# Patient Record
Sex: Male | Born: 1955
Health system: Southern US, Community
[De-identification: ages and names within clinical notes are randomized; demographics above are authoritative.]

## PROBLEM LIST (undated history)

## (undated) DIAGNOSIS — I213 ST elevation (STEMI) myocardial infarction of unspecified site: Secondary | ICD-10-CM

## (undated) DIAGNOSIS — G629 Polyneuropathy, unspecified: Secondary | ICD-10-CM

## (undated) DIAGNOSIS — E785 Hyperlipidemia, unspecified: Secondary | ICD-10-CM

## (undated) DIAGNOSIS — T7840XA Allergy, unspecified, initial encounter: Secondary | ICD-10-CM

## (undated) DIAGNOSIS — I2510 Atherosclerotic heart disease of native coronary artery without angina pectoris: Secondary | ICD-10-CM

## (undated) DIAGNOSIS — E038 Other specified hypothyroidism: Secondary | ICD-10-CM

## (undated) DIAGNOSIS — N400 Enlarged prostate without lower urinary tract symptoms: Secondary | ICD-10-CM

## (undated) DIAGNOSIS — L219 Seborrheic dermatitis, unspecified: Secondary | ICD-10-CM

## (undated) DIAGNOSIS — E039 Hypothyroidism, unspecified: Secondary | ICD-10-CM

## (undated) DIAGNOSIS — I10 Essential (primary) hypertension: Secondary | ICD-10-CM

## (undated) HISTORY — DX: Hyperlipidemia, unspecified: E78.5

## (undated) HISTORY — DX: Seborrheic dermatitis, unspecified: L21.9

## (undated) HISTORY — DX: Other specified hypothyroidism: E03.8

## (undated) HISTORY — PX: APPENDECTOMY: SHX54

## (undated) HISTORY — DX: Allergy, unspecified, initial encounter: T78.40XA

## (undated) HISTORY — DX: Polyneuropathy, unspecified: G62.9

## (undated) HISTORY — DX: Atherosclerotic heart disease of native coronary artery without angina pectoris: I25.10

## (undated) HISTORY — DX: ST elevation (STEMI) myocardial infarction of unspecified site: I21.3

## (undated) HISTORY — DX: Benign prostatic hyperplasia without lower urinary tract symptoms: N40.0

## (undated) HISTORY — DX: Essential (primary) hypertension: I10

## (undated) HISTORY — DX: Hypothyroidism, unspecified: E03.9

---

## 1998-08-16 ENCOUNTER — Encounter: Admission: RE | Admit: 1998-08-16 | Discharge: 1998-09-05 | Payer: Self-pay | Admitting: Family Medicine

## 2003-12-03 LAB — HM COLONOSCOPY

## 2010-10-22 ENCOUNTER — Encounter: Payer: Self-pay | Admitting: Family Medicine

## 2010-10-22 DIAGNOSIS — E039 Hypothyroidism, unspecified: Secondary | ICD-10-CM | POA: Insufficient documentation

## 2010-10-22 DIAGNOSIS — L219 Seborrheic dermatitis, unspecified: Secondary | ICD-10-CM | POA: Insufficient documentation

## 2010-10-22 DIAGNOSIS — K649 Unspecified hemorrhoids: Secondary | ICD-10-CM | POA: Insufficient documentation

## 2010-10-22 DIAGNOSIS — G629 Polyneuropathy, unspecified: Secondary | ICD-10-CM

## 2010-10-22 DIAGNOSIS — E785 Hyperlipidemia, unspecified: Secondary | ICD-10-CM

## 2012-07-18 ENCOUNTER — Other Ambulatory Visit: Payer: Self-pay | Admitting: Physician Assistant

## 2012-07-20 LAB — T4, FREE: Free T4: 1.12 ng/dL (ref 0.80–1.80)

## 2012-07-21 ENCOUNTER — Ambulatory Visit (INDEPENDENT_AMBULATORY_CARE_PROVIDER_SITE_OTHER): Payer: No Typology Code available for payment source | Admitting: Physician Assistant

## 2012-07-21 ENCOUNTER — Encounter: Payer: Self-pay | Admitting: Physician Assistant

## 2012-07-21 VITALS — BP 124/96 | HR 88 | Temp 98.8°F | Resp 18 | Ht 71.5 in | Wt 203.0 lb

## 2012-07-21 DIAGNOSIS — E038 Other specified hypothyroidism: Secondary | ICD-10-CM

## 2012-07-21 DIAGNOSIS — E785 Hyperlipidemia, unspecified: Secondary | ICD-10-CM

## 2012-07-21 DIAGNOSIS — G609 Hereditary and idiopathic neuropathy, unspecified: Secondary | ICD-10-CM

## 2012-07-21 DIAGNOSIS — N400 Enlarged prostate without lower urinary tract symptoms: Secondary | ICD-10-CM | POA: Insufficient documentation

## 2012-07-21 MED ORDER — TAMSULOSIN HCL 0.4 MG PO CAPS: 0.4000 mg | ORAL_CAPSULE | Freq: Every day | ORAL | Status: DC

## 2012-07-21 MED ORDER — PREGABALIN 100 MG PO CAPS: 100.0000 mg | ORAL_CAPSULE | Freq: Three times a day (TID) | ORAL | Status: DC

## 2012-07-21 NOTE — Progress Notes (Signed)
   Patient ID: Cory Wong MRN: [id number], DOB: 1956-03-29, 57 y.o. Date of Encounter: 07/21/2012, 4:52 PM    Chief Complaint: Here to f/u chronic problems  HPI: 57 y.o. year old male here for routine f/u. 1- Peripheral Neuropathy: Currently on Lyrica 100 bid.  Feels pain whenever shoes are in contact with foot. As soon as he is bare footed, pain immediately resolves. When feet get hot, they swell some, and pain worsens.  At work, stands on concrete floor all day. Often gets hot.  2- BPH/OAB:  Pt took Flomax 0.4 for a while. It caused some side effects and did not help so he quit it after approx. 3 weeks. Vesicare caused him to be unable to urinate. Only took it once and quit it.  Has hesitancy. Nocturia. When needs to urinate, if does not go immediatley, develops severe pain.      Home Meds: Lyrica 100mg  BID      Allergies: No Known Allergies    Review of Systems: Constitutional: negative for chills, fever, night sweats, weight changes, or fatigue  HEENT: negative for vision changes, hearing loss, congestion, rhinorrhea, ST, epistaxis, or sinus pressure Cardiovascular: negative for chest pain or palpitations Respiratory: negative for hemoptysis, wheezing, shortness of breath, or cough Abdominal: negative for abdominal pain, nausea, vomiting, diarrhea, or constipation Dermatological: negative for rash Neurologic: negative for headache, dizziness, or syncope    Physical Exam: Blood pressure 124/96, pulse 88, temperature 98.8 F (37.1 C), temperature source Oral, resp. rate 18, height 5' 11.5" (1.816 m), weight 203 lb (92.08 kg)., Body mass index is 27.92 kg/(m^2). General: Well developed, well nourished, in no acute distress.  Neck: Supple. No thyromegaly. Full ROM. No lymphadenopathy. Lungs: Clear bilaterally to auscultation without wheezes, rales, or rhonchi. Breathing is unlabored. Heart: RRR with S1 S2. No murmurs, rubs, or gallops appreciated. Abdomen: Soft,  non-tender, non-distended with normoactive bowel sounds. No hepatomegaly. No rebound/guarding. No obvious abdominal masses. Msk:  Strength and tone normal for age. Extremities/Skin: Warm and dry. No clubbing or cyanosis. No edema. No rashes or suspicious lesions. Neuro: Alert and oriented X 3. Moves all extremities spontaneously. Gait is normal. CNII-XII grossly in tact. Psych:  Responds to questions appropriately with a normal affect.   Labs:   ASSESSMENT AND PLAN:  57 y.o. year old male with: 1- Peripheral Neuropathy: had nml B12 6/12 & 3/13.  Discussed neuro referral/ nerve conduction study. He declines at this time. Wants to try increased lyrica first.  Rx Lyrica 100mg  TID 2- BPH/OAB: Discussed referral to urology as his symptoms are unusual. He declines referral now. Wants to retry flomax.  Rx given. PSA nml. 3- mixed Dyslipidemia: High triglycerides, Low HDL. Cont fish oil. Decrease bread, potato, pasta, sweets. Increase cardiovascular exercise.   -4- Subclinical Hypothyroid; cont to monitor.  [address] [place], [place], [facility] 07/21/2012 4:52 PM

## 2012-12-07 ENCOUNTER — Other Ambulatory Visit: Payer: Self-pay | Admitting: Physician Assistant

## 2012-12-08 ENCOUNTER — Encounter: Payer: Self-pay | Admitting: Family Medicine

## 2012-12-08 NOTE — Telephone Encounter (Signed)
Medication refilled per protocol.  Letter to patient due for check up

## 2013-01-05 ENCOUNTER — Encounter: Payer: Self-pay | Admitting: Physician Assistant

## 2013-01-05 ENCOUNTER — Ambulatory Visit (INDEPENDENT_AMBULATORY_CARE_PROVIDER_SITE_OTHER): Payer: No Typology Code available for payment source | Admitting: Physician Assistant

## 2013-01-05 VITALS — BP 130/88 | HR 68 | Temp 98.0°F | Resp 18 | Wt 200.0 lb

## 2013-01-05 DIAGNOSIS — N400 Enlarged prostate without lower urinary tract symptoms: Secondary | ICD-10-CM

## 2013-01-05 DIAGNOSIS — E038 Other specified hypothyroidism: Secondary | ICD-10-CM

## 2013-01-05 DIAGNOSIS — G629 Polyneuropathy, unspecified: Secondary | ICD-10-CM

## 2013-01-05 DIAGNOSIS — E039 Hypothyroidism, unspecified: Secondary | ICD-10-CM

## 2013-01-05 DIAGNOSIS — G609 Hereditary and idiopathic neuropathy, unspecified: Secondary | ICD-10-CM

## 2013-01-05 NOTE — Progress Notes (Signed)
Patient ID: STEFFEN HASE MRN: [id number], DOB: 1955/05/07, 57 y.o. Date of Encounter: @DATE @  Chief Complaint:  Chief Complaint  Patient presents with  . 6 mth check up    c/o numbness in hands    HPI: 57 y.o. year old white male  presents for her regular office visit followup.  #1 peripheral neuropathy: He has had this problem for years. He feels pain in his feet whenever his shoes are in contact with his foot. As soon as he is barefooted the pain immediately resolves. In addition to contact with his shoes off set of the symptoms are worse whenever his feet are hot.  For his job, he stands on concrete floor all day. They do not have air condition so it often gets very hot. It is at these times he has the worst symptoms. The discomfort does not extend beyond the ankles. This is it is symmetrical bilaterally. In July 2011 he had lab evaluation which was normal. This included a B12 TSH A1c. This is been treated with Lyrica. At the last office visit he was on 100 mg twice a day. At the last office visit we increased it to 3 times a day. However he had states that he had a hard time around to take that second toes because of his work schedule. He has gone back to his prior dose of 100 mg twice a day. He says this is working well. He feels that this is stable and controlled well enough.  #2 BPH: At his last office visit 07/21/12 he restarted Flomax. He says that he is taking this daily now and it is helping. Says in the past he was waking 3-4 times a night having to urinate. He currently only wakes once with nocturia.  #3: He has recently been riding his bicycle on Saturdays with his daughter. They were not on trails that require him to grip his handles firmly. After riding for a while his hands begin to feel numb. We discussed his job and he states that he does have to do a repetitive twisting motion with his hand and wrist at work often. He never wakes at night secondary to any numbness  tingling or pain in the hands or fingers.   Past Medical History  Diagnosis Date  . Allergy   . Elevated lipids   . Seborrhea   . Hemorrhoids   . Peripheral neuropathy   . Subclinical hypothyroidism      Home Meds: See attached medication section for current medication list. Any medications entered into computer today will not appear on this note's list. The medications listed below were entered prior to today. Current Outpatient Prescriptions on File Prior to Visit  Medication Sig Dispense Refill  . fish oil-omega-3 fatty acids 1000 MG capsule Take 500 mg by mouth daily. Takes two 500mg  pills a day      . Multiple Vitamin (MULTIVITAMIN) tablet Take 1 tablet by mouth daily.      . tamsulosin (FLOMAX) 0.4 MG CAPS capsule TAKE 1 CAPSULE (0.4 MG TOTAL) BY MOUTH DAILY.  30 capsule  1   No current facility-administered medications on file prior to visit.    Allergies: No Known Allergies  History   Social History  . Marital Status: Single    Spouse Name: N/A    Number of Children: N/A  . Years of Education: N/A   Occupational History  . Not on file.   Social History Main Topics  . Smoking status: Never  Smoker   . Smokeless tobacco: Never Used  . Alcohol Use: No  . Drug Use: No  . Sexual Activity: Not on file   Other Topics Concern  . Not on file   Social History Narrative  . No narrative on file    No family history on file.   Review of Systems:  See HPI for pertinent ROS. All other ROS negative.    Physical Exam: Blood pressure 130/88, pulse 68, temperature 98 F (36.7 C), temperature source Oral, resp. rate 18, weight 200 lb (90.719 kg)., Body mass index is 27.51 kg/(m^2). General: Well-nourished well-developed white male. Appears in no acute distress. Neck: Supple. No thyromegaly. No lymphadenopathy. Lungs: Clear bilaterally to auscultation without wheezes, rales, or rhonchi. Breathing is unlabored. Heart: RRR with S1 S2. No murmurs, rubs, or  gallops. Musculoskeletal:  Strength and tone normal for age. Tinnells test is negative bilaterally. Phalen test is negative bilaterally. Extremities/Skin: Warm and dry. No clubbing or cyanosis. No edema. No rashes or suspicious lesions. Neuro: Alert and oriented X 3. Moves all extremities spontaneously. Gait is normal. CNII-XII grossly in tact. Psych:  Responds to questions appropriately with a normal affect.     ASSESSMENT AND PLAN:  57 y.o. year old male with  1. Peripheral neuropathy of the feet bilaterally: Stable and controlled. Continue Lyrica 100 mg by mouth twice a day.  2. BPH (benign prostatic hyperplasia) Stable and controlled. Continue Flomax. PSA was performed 07/18/12 and was normal. Can wait one year to repeat.  3. Subclinical hypothyroidism - TSH - T4, free  4. discussed causes of the numbness he is feeling in his hands when he is gripping the bicycle handles a prolonged period. As well he does repetitive motion of his wrist and hands at work. Discussed trying to use a wrist splint to keep his wrist in neutral position. He defers this. Also discussed trying to concentrate old that he can ride in to have some areas of flat smooth surface that he can loosen his grip during these times and rest the wrist and hands at this time is to relieve any compression of nerves. He opts to do this. We'll follow up as needed.  [address] [place], [place], [facility] 01/05/2013 1:54 PM

## 2013-02-08 ENCOUNTER — Other Ambulatory Visit: Payer: Self-pay | Admitting: Physician Assistant

## 2013-02-09 NOTE — Telephone Encounter (Signed)
Medication refilled per protocol. 

## 2013-03-17 ENCOUNTER — Other Ambulatory Visit: Payer: Self-pay | Admitting: Physician Assistant

## 2013-03-17 NOTE — Telephone Encounter (Signed)
Ok to refill 

## 2013-03-20 NOTE — Telephone Encounter (Signed)
Ok to refill 

## 2013-03-20 NOTE — Telephone Encounter (Signed)
Approved for #90+2 additional refills 

## 2013-03-20 NOTE — Telephone Encounter (Signed)
Meds refilled.

## 2013-08-09 ENCOUNTER — Other Ambulatory Visit: Payer: Self-pay | Admitting: Physician Assistant

## 2013-08-09 DIAGNOSIS — G629 Polyneuropathy, unspecified: Secondary | ICD-10-CM

## 2013-08-09 NOTE — Telephone Encounter (Signed)
Last Rf 03/17/13 #90 + 2  Last OV 01/05/13  OK refill?

## 2013-08-09 NOTE — Telephone Encounter (Signed)
Yes it is okay to give him enough refills to last until September 2015.

## 2013-08-10 NOTE — Telephone Encounter (Signed)
RX called in .

## 2013-09-11 ENCOUNTER — Encounter: Payer: Self-pay | Admitting: Physician Assistant

## 2013-09-11 ENCOUNTER — Ambulatory Visit (INDEPENDENT_AMBULATORY_CARE_PROVIDER_SITE_OTHER): Payer: PRIVATE HEALTH INSURANCE | Admitting: Physician Assistant

## 2013-09-11 VITALS — BP 144/104 | HR 68 | Temp 98.4°F | Resp 18 | Ht 73.0 in | Wt 202.0 lb

## 2013-09-11 DIAGNOSIS — F32A Depression, unspecified: Secondary | ICD-10-CM | POA: Insufficient documentation

## 2013-09-11 DIAGNOSIS — F3289 Other specified depressive episodes: Secondary | ICD-10-CM

## 2013-09-11 DIAGNOSIS — G609 Hereditary and idiopathic neuropathy, unspecified: Secondary | ICD-10-CM

## 2013-09-11 DIAGNOSIS — G629 Polyneuropathy, unspecified: Secondary | ICD-10-CM

## 2013-09-11 DIAGNOSIS — F329 Major depressive disorder, single episode, unspecified: Secondary | ICD-10-CM | POA: Insufficient documentation

## 2013-09-11 HISTORY — DX: Depression, unspecified: F32.A

## 2013-09-11 MED ORDER — SERTRALINE HCL 50 MG PO TABS: 50.0000 mg | ORAL_TABLET | Freq: Every day | ORAL | Status: DC

## 2013-09-11 MED ORDER — PREGABALIN 100 MG PO CAPS: 100.0000 mg | ORAL_CAPSULE | Freq: Two times a day (BID) | ORAL | Status: DC

## 2013-09-11 NOTE — Progress Notes (Signed)
Patient ID: Cory Wong MRN: [id number], DOB: 09/06/1955, 58 y.o. Date of Encounter: @DATE @  Chief Complaint:  Chief Complaint  Patient presents with  . stress at work    needs form to return to work,  is seeing counsler, needs forms done today    HPI: 58 y.o. year old white male  presents with above complaint.   He reports that it was about 2 weeks ago that he was at work and made a statement. Says that he was just kidding around but that "a temp"/coworker  "took him seriously". Patient states that work has been stressful. He says that to deal with the stress that he, as well as the other coworkers, kid around. 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 However he states that this temp/coworker apparently went home and thought it over and was concerned. Says that nothing was said to him about this comment immediately, but he states that on Friday, May 1st, he  (patient) was called at home and was told not to return to work until he had appropriate evaluation and treatment.  Today he reports that he is seeing a Veterinary surgeon. Says that so far he has seen Counselor once and that was this past Thursday at 3:00 PM--would've been Thursday, May 7. Says he has a second appointment scheduled with Counselor for today at 3:00.  He says that the counselor has told him that they feel that he has diagnosis of depression.  Patient makes comments that he does not feel depressed except for when he is at work. As well, previously in our conversation he had mentioned his job being stressful. I asked him more about his job. He says the job is not difficult but it is stressful because of the amount of productivity that is expected in a a limited amount of time. Says that he works on old [employer]. Says that he makes them look brand-new. Says this is a job that should take days/multiple hours but instead they expect  finished product  in very limited amount of time. He says that all of the other employees feel the same and they are all feeling stressed and overwhelmed. Says "the company is trying to do more with less people."   Since he made the comment that his depression is only at work, I asked him when was the last time that he was out of work for a whole week. He says that he was off work for one week in March. Says that he stayed around the house and did not go away on a vacation. I asked him if he felt depressed during that week. His response is that he has noticed that things that he used to be interested in doing he just doesn't really feel interested in doing now.  He also says that "Part of the  problem is his daughter".   Says that all she does is sit in the house and play video games. Stays up late at night and talks to friends on line. However says that she has no true friends that she actually talks to in person but only on the computer. Says that she has no driver's license and has no job. Says that at home-- it is just him and  the daughter-- Since he got divorced in 2007.  Says he goes to work and deals with that stress and comes home and  deals with this stress with his daughter.  He also brings up the topic of Lyrica.Whe he presented this information, his thoughts were jumbled and his conversation was very confusing. He presented it as if he thought that the Lyrica was the cause of his current problems. However, after going around and around, it turned out that: He states that in the past when he was on lower dose of Lyrica that he had no side effects and tolerated that fine. However when he increased the dose up to 3 times a day he felt that he cannot concentrate and cannot focus. Says he constantly was feeling confused and symptoms of mental slowing. Says 2 months after her we increased the dose to 100 mg 3 times a day he was having these side effects and he decreased the dose down to taking one  pill twice daily instead of one 3 times daily. Says that when he went back down to the dose of one pill twice daily, he no longer felt those side effects.   Also states that he absolutely has to take the Lyrica. Says that without the Lyrica he cannot even stand for his shoes  to touch his feet.  He denies any homicidal or suicidal ideation.  He reports  "little interest or pleasure in doing things.  Also reports feeling tired and having little energy.  Also reports feeling down depressed or hopeless.  Also reports feeling bad about himself-feeling like a failure or have let yourself/ your family down.   Reports trouble concentrating on things such as reading a newspaper or watching television.     Past Medical History  Diagnosis Date  . Allergy   . Elevated lipids   . Seborrhea   . Hemorrhoids   . Peripheral neuropathy   . Subclinical hypothyroidism      Home Meds: See attached medication section for current medication list. Any medications entered into computer today will not appear on this note's list. The medications listed below were entered prior to today. Current Outpatient Prescriptions on File Prior to Visit  Medication Sig Dispense Refill  . Multiple Vitamin (MULTIVITAMIN) tablet Take 1 tablet by mouth daily.      . tamsulosin (FLOMAX) 0.4 MG CAPS capsule TAKE ONE CAPSULE EVERY DAY  30 capsule  6  . fish oil-omega-3 fatty acids 1000 MG capsule Take 500 mg by mouth daily. Takes two 500mg  pills a day       No current facility-administered medications on file prior to visit.    Allergies: No Known Allergies  History   Social History  . Marital Status: Single    Spouse Name: N/A    Number of Children: N/A  . Years of Education: N/A   Occupational History  . Not on file.   Social History Main Topics  . Smoking status: Never Smoker   . Smokeless tobacco: Never Used  . Alcohol Use: No  . Drug Use: No  . Sexual Activity: Not on file   Other Topics Concern  . Not  on file   Social History Narrative  . No narrative on file    No family history on file.   Review of Systems:  See HPI for pertinent ROS. All other ROS negative.    Physical Exam: Blood pressure 144/94, pulse 68, temperature 98.4 F (36.9 C), temperature source Oral, resp. rate 18, height 6\' 1"  (1.854 m), weight 202 lb (91.627 kg)., Body mass index is 26.66 kg/(m^2). General: WNWD WM.  Appears in no  acute distress. Neck: Supple. No thyromegaly. No lymphadenopathy. Lungs: Clear bilaterally to auscultation without wheezes, rales, or rhonchi. Breathing is unlabored. Heart: RRR with S1 S2. No murmurs, rubs, or gallops. Musculoskeletal:  Strength and tone normal for age. Extremities/Skin: Warm and dry.  Neuro: Alert and oriented X 3. Moves all extremities spontaneously. Gait is normal. CNII-XII grossly in tact. Psych:  He appears anxious. The information he provides to me is choppy--     ASSESSMENT AND PLAN:  58 y.o. year old male with  1. Depression - sertraline (ZOLOFT) 50 MG tablet; Take 1 tablet (50 mg total) by mouth daily.  Dispense: 30 tablet; Refill: 1  2. Peripheral neuropathy - pregabalin (LYRICA) 100 MG capsule; Take 1 capsule (100 mg total) by mouth 2 (two) times daily.  Dispense: 60 capsule; Refill: 5  Discussed stopping the Lyrica but he says that he absolutely has to have this for his feet and also says that he does not think that this is what is causing these current symptoms.  Says that when he first started Lyrica on low dose he was having no side effects. Also, since he went back down to the lower dose-- he has not noticed any side effects. He is agreeable to start medication for depression. Says that his counselor told him that he needed to come here and get medication for depression. He states that he has never been on any such medication in the past. Start Zoloft. Discussed proper expectations at length with him. Discussed that if he is having any significant  side effects to call us immediately. Otherwise, even if  he is seeing no improvement,  to continue taking the medication daily. Discussed that it will take weeks for the medicine to build up in his system to see any effect. Will have him schedule a followup office visit here in 4 weeks to reassess. Followup sooner if he is feeling worse at all or having any adverse effects to the medicine. He will continue to follow up with a counselor in the meantime as well. He reports that he has an appointment with the counselor for this afternoon at 3:00 PM.  NOTE: HE HAS FORM FOR WORK FOR ME TO COMPLETE. He has scheduled his followup office visit with me for Monday 10/09/2013. On his forms I have completed that he will be out of work through 10/09/2013. At his followup appointment 10/09/2013, we will re-assess his mood/ his response to the Zoloft and counseling, and re-assess when he can return to work.  Also, I donot know if it is an option for him to return to work but be given longer amount of time to complete tasks etc.  Also, I will try to followup with his therapist/psychologist and get reports from them to have their input.  > 30 minutes spent in direct face-to-face contact and communication with the patient.  Forms have been completed. Copy sent to be scanned.  Kim to call pt and get name, contact info of counselor and get pts permission for me to contact counselor. Also will inform pt that I put on form for pt to be oow until f/u OV with me 10/09/13.  [address]igned, [place], [place], [facility] 09/11/2013 12:06 PM

## 2013-09-12 ENCOUNTER — Telehealth: Payer: Self-pay | Admitting: Family Medicine

## 2013-09-12 NOTE — Telephone Encounter (Signed)
Pt made aware FMLA forms are ready for pick up.  Provider wanted to know where he was going for counseling.  He said he was seeing a Arrie Senate at [facility].  [address] in [place].  He said if need he would have no problem for the two providers to communicate.  Will have him sign release of information form when he comes to pick up his other forms.

## 2013-10-09 ENCOUNTER — Ambulatory Visit (INDEPENDENT_AMBULATORY_CARE_PROVIDER_SITE_OTHER): Payer: PRIVATE HEALTH INSURANCE | Admitting: Physician Assistant

## 2013-10-09 ENCOUNTER — Encounter: Payer: Self-pay | Admitting: Physician Assistant

## 2013-10-09 VITALS — BP 142/98 | HR 68 | Temp 98.8°F | Resp 18 | Wt 200.0 lb

## 2013-10-09 DIAGNOSIS — F329 Major depressive disorder, single episode, unspecified: Secondary | ICD-10-CM

## 2013-10-09 DIAGNOSIS — F32A Depression, unspecified: Secondary | ICD-10-CM

## 2013-10-09 DIAGNOSIS — F3289 Other specified depressive episodes: Secondary | ICD-10-CM

## 2013-10-09 MED ORDER — FLUOXETINE HCL 20 MG PO TABS: 20.0000 mg | ORAL_TABLET | Freq: Every day | ORAL | Status: DC

## 2013-10-09 NOTE — Progress Notes (Signed)
Patient ID: Cory Wong MRN: [id number], DOB: Oct 02, 1955, 58 y.o. Date of Encounter: @DATE @  Chief Complaint:  Chief Complaint  Patient presents with  . 4 week follow up    stomach pains from meds    HPI: 58 y.o. year old white male  presents with above complaint.   HE HAD OV 09/11/2013. THE FOLLOWING IS COPIED FROM THAT OV NOTE:   He reports that it was about 2 weeks ago that he was at work and made a statement. Says that he was just kidding around but that "a temp"/coworker  "took him seriously". Patient states that work has been stressful. He says that to deal with the stress that he, as well as the other coworkers, kid around. 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 However he states that this temp/coworker apparently went home and thought it over and was concerned. Says that nothing was said to him about this comment immediately, but he states that on Friday, May 1st, he  (patient) was called at home and was told not to return to work until he had appropriate evaluation and treatment.  Today he reports that he is seeing a Veterinary surgeon. Says that so far he has seen Counselor once and that was this past Thursday at 3:00 PM--would've been Thursday, May 7. Says he has a second appointment scheduled with Counselor for today at 3:00.  He says that the counselor has told him that they feel that he has diagnosis of depression.  Patient makes comments that he does not feel depressed except for when he is at work. As well, previously in our conversation he had mentioned his job being stressful. I asked him more about his job. He says the job is not difficult but it is stressful because of the amount of productivity that is expected in a a limited amount of time. Says that he works on old [employer]. Says that he makes them look brand-new. Says this is a job that should take days/multiple hours but  instead they expect finished product  in very limited amount of time. He says that all of the other employees feel the same and they are all feeling stressed and overwhelmed. Says "the company is trying to do more with less people."   Since he made the comment that his depression is only at work, I asked him when was the last time that he was out of work for a whole week. He says that he was off work for one week in March. Says that he stayed around the house and did not go away on a vacation. I asked him if he felt depressed during that week. His response is that he has noticed that things that he used to be interested in doing he just doesn't really feel interested in doing now.  He also says that "Part of the  problem is his daughter".   Says that all she does is sit in the house and play video games. Stays up late at night and talks to friends on line. However says that she has no true friends that she actually talks to in person but only on the computer. Says that she has no driver's license and has no job. Says that at home-- it is just him and  the daughter-- Since he got divorced in 2007.  Says he goes to work and deals with that  stress and comes home and deals with this stress with his daughter.  He also brings up the topic of Lyrica.Whe he presented this information, his thoughts were jumbled and his conversation was very confusing. He presented it as if he thought that the Lyrica was the cause of his current problems. However, after going around and around, it turned out that: He states that in the past when he was on lower dose of Lyrica that he had no side effects and tolerated that fine. However when he increased the dose up to 3 times a day he felt that he cannot concentrate and cannot focus. Says he constantly was feeling confused and symptoms of mental slowing. Says 2 months after her we increased the dose to 100 mg 3 times a day he was having these side effects and he decreased the  dose down to taking one pill twice daily instead of one 3 times daily. Says that when he went back down to the dose of one pill twice daily, he no longer felt those side effects.   Also states that he absolutely has to take the Lyrica. Says that without the Lyrica he cannot even stand for his shoes  to touch his feet.  He denies any homicidal or suicidal ideation.  He reports  "little interest or pleasure in doing things.  Also reports feeling tired and having little energy.  Also reports feeling down depressed or hopeless.  Also reports feeling bad about himself-feeling like a failure or have let yourself/ your family down.   Reports trouble concentrating on things such as reading a newspaper or watching television.  THE FOLLOWING IS THE ASSESSMENT / PLAN FROM OV NOTE 09/11/2013:   1. Depression - sertraline (ZOLOFT) 50 MG tablet; Take 1 tablet (50 mg total) by mouth daily.  Dispense: 30 tablet; Refill: 1  2. Peripheral neuropathy - pregabalin (LYRICA) 100 MG capsule; Take 1 capsule (100 mg total) by mouth 2 (two) times daily.  Dispense: 60 capsule; Refill: 5  Discussed stopping the Lyrica but he says that he absolutely has to have this for his feet and also says that he does not think that this is what is causing these current symptoms.  Says that when he first started Lyrica on low dose he was having no side effects. Also, since he went back down to the lower dose-- he has not noticed any side effects. He is agreeable to start medication for depression. Says that his counselor told him that he needed to come here and get medication for depression. He states that he has never been on any such medication in the past. Start Zoloft. Discussed proper expectations at length with him. Discussed that if he is having any significant side effects to call us immediately. Otherwise, even if  he is seeing no improvement,  to continue taking the medication daily. Discussed that it will take weeks for the  medicine to build up in his system to see any effect. Will have him schedule a followup office visit here in 4 weeks to reassess. Followup sooner if he is feeling worse at all or having any adverse effects to the medicine. He will continue to follow up with a counselor in the meantime as well. He reports that he has an appointment with the counselor for this afternoon at 3:00 PM.  NOTE: HE HAS FORM FOR WORK FOR ME TO COMPLETE. He has scheduled his followup office visit with me for Monday 10/09/2013. On his forms I have completed  that he will be out of work through 10/09/2013. At his followup appointment 10/09/2013, we will re-assess his mood/ his response to the Zoloft and counseling, and re-assess when he can return to work.  Also, I donot know if it is an option for him to return to work but be given longer amount of time to complete tasks etc.  Also, I will try to followup with his therapist/psychologist and get reports from them to have their input.  > 30 minutes spent in direct face-to-face contact and communication with the patient.  Forms have been completed. Copy sent to be scanned.  Kim to call pt and get name, contact info of counselor and get pts permission for me to contact counselor. Also will inform pt that I put on form for pt to be oow until f/u OV with me 10/09/13.   THE FOLLOWING IS THE INFO HE PROVIDES TO ME TODAY: He reports that for the first 2 weeks of taking the Zoloft he had some nausea and also some abdominal pain. However he was hoping that this is just him getting used to it and it would improve. However, he says instead, the abdominal pain worsened on the third week. He decided he would stop the medicine and fever the symptoms would resolve. The abdominal pain did resolve. Then he started the medicine back and abdominal pain reoccurred. At his last dose was Friday 09/05/13. Has had no medicine for 3 days now and currently is having no abdominal discomfort.  Also he has  continued to see the therapist. Has been going there once a week. Has 2 more sessions for total of 6 sessions. Says he is currently getting half pay --on short-term disability. Says that after the 6 session she will notify his work that he is ready to return to work.  Today he also tells me more about his daughter. Says she was homeschooled but she would get in major arguments with her mother. Therefore, she did not even finish up the home schooling and does not even have a GED. Says that she never goes out of the house. Does not even have a driver's license. Says that she plays video games all the time. Says that she wants to meet a  boyfriend through the computer.   Past Medical History  Diagnosis Date  . Allergy   . Elevated lipids   . Seborrhea   . Hemorrhoids   . Peripheral neuropathy   . Subclinical hypothyroidism      Home Meds: See attached medication section for current medication list. Any medications entered into computer today will not appear on this note's list. The medications listed below were entered prior to today. Current Outpatient Prescriptions on File Prior to Visit  Medication Sig Dispense Refill  . fish oil-omega-3 fatty acids 1000 MG capsule Take 500 mg by mouth daily. Takes two 500mg  pills a day      . Multiple Vitamin (MULTIVITAMIN) tablet Take 1 tablet by mouth daily.      . pregabalin (LYRICA) 100 MG capsule Take 1 capsule (100 mg total) by mouth 2 (two) times daily.  60 capsule  5  . tamsulosin (FLOMAX) 0.4 MG CAPS capsule TAKE ONE CAPSULE EVERY DAY  30 capsule  6   No current facility-administered medications on file prior to visit.    Allergies: No Known Allergies  History   Social History  . Marital Status: Single    Spouse Name: N/A    Number of Children: N/A  .  Years of Education: N/A   Occupational History  . Not on file.   Social History Main Topics  . Smoking status: Never Smoker   . Smokeless tobacco: Never Used  . Alcohol Use: No  .  Drug Use: No  . Sexual Activity: Not on file   Other Topics Concern  . Not on file   Social History Narrative  . No narrative on file    No family history on file.   Review of Systems:  See HPI for pertinent ROS. All other ROS negative.    Physical Exam: Blood pressure 144/94, pulse 68, temperature 98.4 F (36.9 C), temperature source Oral, resp. rate 18, height 6\' 1"  (1.854 m), weight 202 lb (91.627 kg)., Body mass index is 26.39 kg/(m^2). General: WNWD WM.  Appears in no acute distress. Neck: Supple. No thyromegaly. No lymphadenopathy. Lungs: Clear bilaterally to auscultation without wheezes, rales, or rhonchi. Breathing is unlabored. Heart: RRR with S1 S2. No murmurs, rubs, or gallops. Musculoskeletal:  Strength and tone normal for age. Extremities/Skin: Warm and dry.  Neuro: Alert and oriented X 3. Moves all extremities spontaneously. Gait is normal. CNII-XII grossly in tact. Psych:  He appears anxious. The information he provides to me is choppy--     ASSESSMENT AND PLAN:  58 y.o. year old male with   1. Depression - FLUoxetine (PROZAC) 20 MG tablet; Take 1 tablet (20 mg total) by mouth daily.  Dispense: 30 tablet; Refill: 1 Stay Off of the Zoloft. Start Prozac. Minded him again today--has adverse effects to notify me. Otherwise even if it seems to be having no effect continue taking it daily and to followup office visit. Continue followup with therapist as scheduled. Will schedule followup office visit here in 4 weeks or follow up sooner if needed.    [address] [place], [place], [place] [facility] 10/09/2013 4:59 PM

## 2013-10-13 ENCOUNTER — Telehealth: Payer: Self-pay | Admitting: *Deleted

## 2013-10-13 NOTE — Telephone Encounter (Signed)
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

## 2013-11-06 ENCOUNTER — Ambulatory Visit: Payer: PRIVATE HEALTH INSURANCE | Admitting: Physician Assistant

## 2016-09-29 ENCOUNTER — Encounter: Payer: Self-pay | Admitting: Family Medicine

## 2016-09-29 ENCOUNTER — Ambulatory Visit (INDEPENDENT_AMBULATORY_CARE_PROVIDER_SITE_OTHER): Payer: PRIVATE HEALTH INSURANCE | Admitting: Family Medicine

## 2016-09-29 VITALS — BP 138/82 | HR 76 | Temp 98.0°F | Resp 16 | Ht 73.0 in | Wt 186.0 lb

## 2016-09-29 DIAGNOSIS — N401 Enlarged prostate with lower urinary tract symptoms: Secondary | ICD-10-CM | POA: Diagnosis not present

## 2016-09-29 DIAGNOSIS — Z125 Encounter for screening for malignant neoplasm of prostate: Secondary | ICD-10-CM | POA: Diagnosis not present

## 2016-09-29 DIAGNOSIS — E039 Hypothyroidism, unspecified: Secondary | ICD-10-CM

## 2016-09-29 DIAGNOSIS — R1032 Left lower quadrant pain: Secondary | ICD-10-CM

## 2016-09-29 DIAGNOSIS — N138 Other obstructive and reflux uropathy: Secondary | ICD-10-CM

## 2016-09-29 DIAGNOSIS — R300 Dysuria: Secondary | ICD-10-CM | POA: Diagnosis not present

## 2016-09-29 DIAGNOSIS — Z0000 Encounter for general adult medical examination without abnormal findings: Secondary | ICD-10-CM | POA: Diagnosis not present

## 2016-09-29 DIAGNOSIS — Z131 Encounter for screening for diabetes mellitus: Secondary | ICD-10-CM | POA: Diagnosis not present

## 2016-09-29 DIAGNOSIS — R350 Frequency of micturition: Secondary | ICD-10-CM | POA: Diagnosis not present

## 2016-09-29 DIAGNOSIS — E038 Other specified hypothyroidism: Secondary | ICD-10-CM

## 2016-09-29 DIAGNOSIS — R3129 Other microscopic hematuria: Secondary | ICD-10-CM

## 2016-09-29 LAB — POC MICROSCOPIC URINALYSIS (UMFC): MUCUS RE: ABSENT

## 2016-09-29 LAB — POCT URINALYSIS DIP (MANUAL ENTRY)
Bilirubin, UA: NEGATIVE
GLUCOSE UA: NEGATIVE mg/dL
Ketones, POC UA: NEGATIVE mg/dL
Leukocytes, UA: NEGATIVE
NITRITE UA: NEGATIVE
PH UA: 6.5 (ref 5.0–8.0)
Protein Ur, POC: NEGATIVE mg/dL
Spec Grav, UA: <=1.005 — AB (ref 1.010–1.025)
UROBILINOGEN UA: 0.2 U/dL

## 2016-09-29 LAB — POCT CBC
Granulocyte percent: 61.7 %G (ref 37–80)
HEMATOCRIT: 46.7 % (ref 43.5–53.7)
Hemoglobin: 16.5 g/dL (ref 14.1–18.1)
LYMPH, POC: 1.8 (ref 0.6–3.4)
MCH, POC: 30.3 pg (ref 27–31.2)
MCHC: 35.2 g/dL (ref 31.8–35.4)
MCV: 86 fL (ref 80–97)
MID (cbc): 0.5 (ref 0–0.9)
MPV: 8.5 fL (ref 0–99.8)
POC GRANULOCYTE: 3.8 (ref 2–6.9)
POC LYMPH %: 29.6 % (ref 10–50)
POC MID %: 8.7 % (ref 0–12)
Platelet Count, POC: 255 10*3/uL (ref 142–424)
RBC: 5.43 M/uL (ref 4.69–6.13)
RDW, POC: 13.1 %
WBC: 6.2 10*3/uL (ref 4.6–10.2)

## 2016-09-29 LAB — POCT GLYCOSYLATED HEMOGLOBIN (HGB A1C): Hemoglobin A1C: 5.7

## 2016-09-29 NOTE — Progress Notes (Signed)
Chief Complaint  Patient presents with  . Abdominal Pain    Lower abdominal pain x months  . Dysuria  . Hypertension    (Not what pt is here for today but he did mention it has been an issue)    HPI   This a new patient here to establish care and to get a physical and discuss problems  Dysuria and urinary frequency Pt reports intermittent abdominal pain for 3-4 months The pain is aggravated when he had to work Fr, Sat, Sunday He states that at work if he drank coffee he would get a sting in his pelvis.  Once he goes to the bathroom to urinate his symptoms would get better.  His pain with urination has been in the past few weeks. He states that it might last for one hour in the morning and resolve over the course of the day. Now his urinary pain is present all day long.  He stopped drinking soda and citrus He started drinking cranberry juice which helps He states that in the mornings he was taking super beta prostate but stopped because it was helping him with urinary frequency.  Chronic constipation He states that he consumes fiber because he gets constipation He reports that he gets gassy from the fiber and notices that he gets pain with urination until he passes flatus.   Hypertension He stopped taking his bp medications because it was causing him to have constipation He reports that his blood pressures have been good Denies chest pains, palpitations, shortness of breath, lower extremity edema BP Readings from Last 3 Encounters:  09/29/16 138/82  10/09/13 (!) 142/98  09/11/13 (!) 144/104   Subclinical Hypothyroid  He reports that he was told that his thyroid was abnormal due to his lyrica use He states that he is off lyrica at this time He denies any symptoms of neck mass or goiter He reports that he is sleeping well and only has constipation No weakness  Past Medical History:  Diagnosis Date  . Allergy   . Elevated lipids   . Hemorrhoids   . Peripheral neuropathy     . Seborrhea   . Subclinical hypothyroidism     Current Outpatient Prescriptions  Medication Sig Dispense Refill  . Multiple Vitamin (MULTIVITAMIN) tablet Take 1 tablet by mouth daily.    . fish oil-omega-3 fatty acids 1000 MG capsule Take 500 mg by mouth daily. Takes two 500mg  pills a day     No current facility-administered medications for this visit.     Allergies: No Known Allergies  Past Surgical History:  Procedure Laterality Date  . APPENDECTOMY      Social History   Social History  . Marital status: Single    Spouse name: N/A  . Number of children: N/A  . Years of education: N/A   Social History Main Topics  . Smoking status: Never Smoker  . Smokeless tobacco: Never Used  . Alcohol use No  . Drug use: No  . Sexual activity: Not Asked   Other Topics Concern  . None   Social History Narrative  . None   Social  Exercise 3x per week at work No eye exam or dental exam in 12 months  Review of Systems  Constitutional: Negative for chills, fever, malaise/fatigue and weight loss.  Respiratory: Negative for cough.   Cardiovascular: Negative for chest pain, palpitations and orthopnea.  Gastrointestinal: Positive for abdominal pain and constipation. Negative for blood in stool, diarrhea, heartburn, nausea and vomiting.  Genitourinary: Positive for dysuria, frequency and urgency. Negative for flank pain and hematuria.   See hpi  Objective: Vitals:   09/29/16 0902 09/29/16 0919  BP: (!) 155/111 138/82  Pulse: 76   Resp: 16   Temp: 98 F (36.7 C)   TempSrc: Oral   SpO2: 95%   Weight: 186 lb (84.4 kg)   Height: 6\' 1"  (1.854 m)     Physical Exam  Constitutional: He is oriented to person, place, and time. He appears well-developed and well-nourished.  HENT:  Head: Normocephalic and atraumatic.  Right Ear: External ear normal.  Left Ear: External ear normal.  Mouth/Throat: Oropharynx is clear and moist.  Eyes: Conjunctivae and EOM are normal.  Neck:  Normal range of motion. Neck supple. No thyromegaly present.  Cardiovascular: Normal rate, regular rhythm and normal heart sounds.   Pulmonary/Chest: Effort normal and breath sounds normal. No respiratory distress. He has no wheezes.  Abdominal: Soft. Bowel sounds are normal. He exhibits no distension and no mass. There is no tenderness. There is no guarding.  Genitourinary:  Genitourinary Comments: Enlarged smooth prostate  Neurological: He is alert and oriented to person, place, and time.  Skin: Skin is warm. Capillary refill takes less than 2 seconds. No rash noted. No erythema.  Psychiatric: He has a normal mood and affect. His behavior is normal. Judgment and thought content normal.    Assessment and Plan Geronimo was seen today for abdominal pain, dysuria and hypertension.  Diagnoses and all orders for this visit:  Encounter for health maintenance examination in adult  Dysuria -     POCT Microscopic Urinalysis ([facility]) -     POCT urinalysis dipstick -     POCT CBC  Left lower quadrant pain- no UTI, normal cbc, normal abd exam -     POCT Microscopic Urinalysis ([facility]) -     POCT urinalysis dipstick -     POCT CBC  BPH with obstruction/lower urinary tract symptoms- discussed psa today -     PSA  Urinary frequency -     POCT glycosylated hemoglobin (Hb A1C)  Screening PSA (prostate specific antigen) -     PSA  Screening for diabetes mellitus -     POCT glycosylated hemoglobin (Hb A1C)  Subclinical hypothyroidism- will check  -     TSH -     T4, Free  Other microscopic hematuria-  Will send urine culture  -     Urine culture     Aurea Aronov A Ellora Varnum

## 2016-09-29 NOTE — Patient Instructions (Addendum)
   IF you received an x-ray today, you will receive an invoice from [facility]. Please contact [facility] at [phone] with questions or concerns regarding your invoice.   IF you received labwork today, you will receive an invoice from [facility]. Please contact [facility] at [phone] with questions or concerns regarding your invoice.   Our billing staff will not be able to assist you with questions regarding bills from these companies.  You will be contacted with the lab results as soon as they are available. The fastest way to get your results is to activate your My Chart account. Instructions are located on the last page of this paperwork. If you have not heard from us regarding the results in 2 weeks, please contact this office.      Benign Prostatic Hypertrophy The prostate gland is part of the reproductive system of men. A normal prostate is about the size and shape of a walnut. The prostate gland produces a fluid that is mixed with sperm to make semen. This gland surrounds the urethra and is located in front of the rectum and just below the bladder. The bladder is where urine is stored. The urethra is the tube through which urine passes from the bladder to get out of the body. The prostate grows as a man ages. An enlarged prostate not caused by cancer is called benign prostatic hypertrophy (BPH). An enlarged prostate can press on the urethra. This can make it harder to pass urine. In the early stages of enlargement, the bladder can get by with a narrowed urethra by forcing the urine through. If the problem gets worse, medical or surgical treatment may be required. This condition should be followed by your health care provider. The accumulation of urine in the bladder can cause infection. Back pressure and infection can progress to bladder damage and kidney (renal) failure. If needed, your health care provider may refer you to a specialist in kidney and prostate  disease (urologist). What are the causes? BPH is a common health problem in men older than 50 years. This condition is a normal part of aging. However, not all men will develop problems from this condition. If the enlargement grows away from the urethra, then there will not be any compression of the urethra and resistance to urine flow.If the growth is toward the urethra and compresses it, you will experience difficulty urinating. What are the signs or symptoms?  Not able to completely empty your bladder.  Getting up often during the night to urinate.  Need to urinate frequently during the day.  Difficultly starting urine flow.  Decrease in size and strength of your urine stream.  Dribbling after urination.  Pain on urination (more common with infection).  Inability to pass urine. This needs immediate treatment.  The development of a urinary tract infection. How is this diagnosed? These tests will help your health care provider understand your problem:  A thorough history and physical examination.  A urination history, with the number of times you urinate, the amounts of urine, the strength of the urine stream, and the feeling of emptiness or fullness after urinating.  A postvoid bladder scan that measures any amount of urine that may remain in your bladder after you finish urinating.  Digital rectal exam. In a rectal exam, your health care provider checks your prostate by putting a gloved, lubricated finger into your rectum to feel the back of your prostate gland. This exam detects the size of your gland and abnormal   lumps or growths.  Exam of your urine (urinalysis).  Prostate specific antigen (PSA) screening. This is a blood test used to screen for prostate cancer.  Rectal ultrasonography. This test uses sound waves to electronically produce a picture of your prostate gland.  How is this treated? Once symptoms begin, your health care provider will monitor your condition.  Of the men with this condition, one third will have symptoms that stabilize, one third will have symptoms that improve, and one third will have symptoms that progress in the first year. Mild symptoms may not need treatment. Simple observation and yearly exams may be all that is required. Medicines and surgery are options for more severe problems. Your health care provider can help you make an informed decision for what is best. Two classes of medicines are available for relief of prostate symptoms:  Medicines that shrink the prostate. This helps relieve symptoms. These medicines take time to work, and it may be months before any improvement is seen. ? Uncommon side effects include problems with sexual function.  Medicines to relax the muscle of the prostate. This also relieves the obstruction by reducing any compression on the urethra.This group of medicines work much faster than those that reduce the size of the prostate gland. Usually, one can experience improvement in days to weeks.. ? Side effects can include dizziness, fatigue, lightheadedness, and retrograde ejaculation (diminished volume of ejaculate).  Several types of surgical treatments are available for relief of prostate symptoms:  Transurethral resection of the prostate (TURP)-In this treatment, an instrument is inserted through opening at the tip of the penis. It is used to cut away pieces of the inner core of the prostate. The pieces are removed through the same opening of the penis. This removes the obstruction and helps get rid of the symptoms.  Transurethral incision (TUIP)-In this procedure, small cuts are made in the prostate. This lessens the prostates pressure on the urethra.  Transurethral microwave thermotherapy (TUMT)-This procedure uses microwaves to create heat. The heat destroys and removes a small amount of prostate tissue.  Transurethral needle ablation (TUNA)-This is a procedure that uses radio frequencies to do the  same as TUMT.  Interstitial laser coagulation (ILC)-This is a procedure that uses a laser to do the same as TUMT and TUNA.  Transurethral electrovaporization (TUVP)-This is a procedure that uses electrodes to do the same as the procedures listed above.  Contact a health care provider if:  You develop a fever.  There is unexplained back pain.  Symptoms are not helped by medicines prescribed.  You develop side effects from the medicine you are taking.  Your urine becomes very dark or has a bad smell.  Your lower abdomen becomes distended and you have difficulty passing your urine. Get help right away if:  You are suddenly unable to urinate. This is an emergency. You should be seen immediately.  There are large amounts of blood or clots in the urine.  Your urinary problems become unmanageable.  You develop lightheadedness, severe dizziness, or you feel faint.  You develop moderate to severe low back or flank pain.  You develop chills or fever. This information is not intended to replace advice given to you by your health care provider. Make sure you discuss any questions you have with your health care provider. Document Released: 04/20/2005 Document Revised: 10/02/2015 Document Reviewed: 11/03/2012 Elsevier Interactive Patient Education  2017 [employer]  

## 2016-09-30 LAB — T4, FREE: FREE T4: 1.24 ng/dL (ref 0.82–1.77)

## 2016-09-30 LAB — URINE CULTURE: ORGANISM ID, BACTERIA: NO GROWTH

## 2016-09-30 LAB — TSH: TSH: 7.4 u[IU]/mL — AB (ref 0.450–4.500)

## 2016-09-30 LAB — PSA: PROSTATE SPECIFIC AG, SERUM: 2 ng/mL (ref 0.0–4.0)

## 2016-12-09 ENCOUNTER — Ambulatory Visit (INDEPENDENT_AMBULATORY_CARE_PROVIDER_SITE_OTHER): Payer: PRIVATE HEALTH INSURANCE | Admitting: Family Medicine

## 2016-12-09 ENCOUNTER — Encounter: Payer: Self-pay | Admitting: Family Medicine

## 2016-12-09 VITALS — BP 146/93 | HR 74 | Temp 98.0°F | Resp 16 | Ht 73.0 in | Wt 187.6 lb

## 2016-12-09 DIAGNOSIS — R7303 Prediabetes: Secondary | ICD-10-CM

## 2016-12-09 DIAGNOSIS — R946 Abnormal results of thyroid function studies: Secondary | ICD-10-CM | POA: Diagnosis not present

## 2016-12-09 DIAGNOSIS — R7989 Other specified abnormal findings of blood chemistry: Secondary | ICD-10-CM

## 2016-12-09 LAB — POCT GLYCOSYLATED HEMOGLOBIN (HGB A1C): HEMOGLOBIN A1C: 5.7

## 2016-12-09 NOTE — Progress Notes (Signed)
  Chief Complaint  Patient presents with  . Follow-up    LAB.   Pt concerned about being prediabetic as he is having prediabetic symptoms    HPI  Pt reports that he has been having urinary symptoms especially if he drinks coffee especially at work. He gets burning sharp pains in his penis before urinating He only has this issue from work coffee He reports that he would like to follow up on his prediabetic status. He reports that he saw urology years ago and had a scope and prostate testing.  He is concerned about his prediabetes Lab Results  Component Value Date   HGBA1C 5.7 12/09/2016   Subclinical Hypothyroidism He has a history of subclinical hypothyroidism and reports that he has been taking iodine in a supplement He states that he does not have any thyroid symptoms No fatigue, skin changes, hair changes, bowel changes, palpitations Lab Results  Component Value Date   TSH 7.400 (H) 09/29/2016     Past Medical History:  Diagnosis Date  . Allergy   . Elevated lipids   . Hemorrhoids   . Peripheral neuropathy   . Seborrhea   . Subclinical hypothyroidism     Current Outpatient Prescriptions  Medication Sig Dispense Refill  . Multiple Vitamin (MULTIVITAMIN) tablet Take 1 tablet by mouth daily.    . fish oil-omega-3 fatty acids 1000 MG capsule Take 500 mg by mouth daily. Takes two 500mg  pills a day     No current facility-administered medications for this visit.     Allergies: No Known Allergies  Past Surgical History:  Procedure Laterality Date  . APPENDECTOMY      Social History   Social History  . Marital status: Single    Spouse name: N/A  . Number of children: N/A  . Years of education: N/A   Social History Main Topics  . Smoking status: Never Smoker  . Smokeless tobacco: Never Used  . Alcohol use No  . Drug use: No  . Sexual activity: Not Asked   Other Topics Concern  . None   Social History Narrative  . None    ROS See  hpi  Objective: Vitals:   12/09/16 1128  BP: (!) 147/97  Pulse: 68  Resp: 16  Temp: 98 F (36.7 C)  TempSrc: Oral  SpO2: 98%  Weight: 187 lb 9.6 oz (85.1 kg)  Height: 6\' 1"  (1.854 m)    Physical Exam  Constitutional: He appears well-developed and well-nourished.  HENT:  Head: Normocephalic and atraumatic.  Cardiovascular: Normal rate, regular rhythm and normal heart sounds.   Pulmonary/Chest: Effort normal and breath sounds normal. No respiratory distress. He has no wheezes.  Skin: Skin is warm. Capillary refill takes less than 2 seconds.  Psychiatric: He has a normal mood and affect. His behavior is normal. Judgment and thought content normal.    Assessment and Plan Breighton was seen today for follow-up.  Diagnoses and all orders for this visit:  Prediabetes- a1c is now stable, pt should continue a healthy diet and exercise -     POCT glycosylated hemoglobin (Hb A1C)  Subclinical Hypothyroidism- will check tsh and t4 today     Trayonna Bachmeier A Creta Levin

## 2016-12-09 NOTE — Patient Instructions (Addendum)
   IF you received an x-ray today, you will receive an invoice from [facility]. Please contact [facility] at [phone] with questions or concerns regarding your invoice.   IF you received labwork today, you will receive an invoice from [facility]. Please contact [facility] at [phone] with questions or concerns regarding your invoice.   Our billing staff will not be able to assist you with questions regarding bills from these companies.  You will be contacted with the lab results as soon as they are available. The fastest way to get your results is to activate your My Chart account. Instructions are located on the last page of this paperwork. If you have not heard from us regarding the results in 2 weeks, please contact this office.    Prediabetes Eating Plan Prediabetes-also called impaired glucose tolerance or impaired fasting glucose-is a condition that causes blood sugar (blood glucose) levels to be higher than normal. Following a healthy diet can help to keep prediabetes under control. It can also help to lower the risk of type 2 diabetes and heart disease, which are increased in people who have prediabetes. Along with regular exercise, a healthy diet:  Promotes weight loss.  Helps to control blood sugar levels.  Helps to improve the way that the body uses insulin.  What do I need to know about this eating plan?  Use the glycemic index (GI) to plan your meals. The index tells you how quickly a food will raise your blood sugar. Choose low-GI foods. These foods take a longer time to raise blood sugar.  Pay close attention to the amount of carbohydrates in the food that you eat. Carbohydrates increase blood sugar levels.  Keep track of how many calories you take in. Eating the right amount of calories will help you to achieve a healthy weight. Losing about 7 percent of your starting weight can help to prevent type 2 diabetes.  You may want to follow a  Mediterranean diet. This diet includes a lot of vegetables, lean meats or fish, whole grains, fruits, and healthy oils and fats. What foods can I eat? Grains Whole grains, such as whole-wheat or whole-grain breads, crackers, cereals, and pasta. Unsweetened oatmeal. Bulgur. Barley. Quinoa. Brown rice. Corn or whole-wheat flour tortillas or taco shells. Vegetables Lettuce. Spinach. Peas. Beets. Cauliflower. Cabbage. Broccoli. Carrots. Tomatoes. Squash. Eggplant. Herbs. Peppers. Onions. Cucumbers. Brussels sprouts. Fruits Berries. Bananas. Apples. Oranges. Grapes. Papaya. Mango. Pomegranate. Kiwi. Grapefruit. Cherries. Meats and Other Protein Sources Seafood. Lean meats, such as chicken and turkey or lean cuts of pork and beef. Tofu. Eggs. Nuts. Beans. Dairy Low-fat or fat-free dairy products, such as yogurt, cottage cheese, and cheese. Beverages Water. Tea. Coffee. Sugar-free or diet soda. Seltzer water. Milk. Milk alternatives, such as soy or almond milk. Condiments Mustard. Relish. Low-fat, low-sugar ketchup. Low-fat, low-sugar barbecue sauce. Low-fat or fat-free mayonnaise. Sweets and Desserts Sugar-free or low-fat pudding. Sugar-free or low-fat ice cream and other frozen treats. Fats and Oils Avocado. Walnuts. Olive oil. The items listed above may not be a complete list of recommended foods or beverages. Contact your dietitian for more options. What foods are not recommended? Grains Refined white flour and flour products, such as bread, pasta, snack foods, and cereals. Beverages Sweetened drinks, such as sweet iced tea and soda. Sweets and Desserts Baked goods, such as cake, cupcakes, pastries, cookies, and cheesecake. The items listed above may not be a complete list of foods and beverages to avoid. Contact your dietitian for more information.   This information is not intended to replace advice given to you by your health care provider. Make sure you discuss any questions you have with  your health care provider. Document Released: 09/04/2014 Document Revised: 09/26/2015 Document Reviewed: 05/16/2014 Elsevier Interactive Patient Education  2017 [employer]  

## 2016-12-10 ENCOUNTER — Other Ambulatory Visit: Payer: Self-pay | Admitting: Family Medicine

## 2016-12-10 LAB — TSH+FREE T4
FREE T4: 1.04 ng/dL (ref 0.82–1.77)
TSH: 10.67 u[IU]/mL — ABNORMAL HIGH (ref 0.450–4.500)

## 2016-12-10 MED ORDER — LEVOTHYROXINE SODIUM 25 MCG PO TABS
25.0000 ug | ORAL_TABLET | Freq: Every day | ORAL | 0 refills | Status: DC
Start: 2016-12-10 — End: 2017-03-08

## 2016-12-10 NOTE — Progress Notes (Signed)
Levothyroxine prescribed for increasing TSH of 10

## 2017-02-25 ENCOUNTER — Telehealth: Payer: Self-pay | Admitting: Family Medicine

## 2017-02-25 NOTE — Telephone Encounter (Signed)
Pt.'s daughter calling on behalf of her father. On his visit of 09/29/16 the pt.'s daughter asserts that her father was only to be charged an physical visit. Her father had initially come in for an office visit but had requested to have the visit charged as a physical due to the insurance company paying for one physical a year. The pt.'s daughter is unsure as to what happened in the office.   The pt. has been charged for both an office vist and a physical and the pt.'s daughter Is requesting that the charges for the physical be dropped. The pt. Would like to be able to speak with Dr. Creta Levin about the charges and understand exactly as to why the charges has been input them this way.   Note: there is no CPE letter in the patients documents for this date (09/29/16)  Best number [phone]

## 2017-03-02 NOTE — Telephone Encounter (Signed)
Please advise 

## 2017-03-03 NOTE — Telephone Encounter (Signed)
I am not sure what to do because I did a physical and I addressed problems and checked labs. Portions on the physical like psa testing was performed. I cannot change it. Maybe we can set up a payment plan for them.

## 2017-03-04 NOTE — Telephone Encounter (Signed)
Looks like his insurance doesn't cover a physical at all and gave a denial of non-covered charges.  I won't be able to call on it because I'm TL both today and tomorrow, so I have sent this to [place].

## 2017-03-04 NOTE — Telephone Encounter (Signed)
Can you follow up with this.  

## 2017-03-04 NOTE — Telephone Encounter (Signed)
Renae Fickle has confirmed with the insurance company that this is a very limited plan that does NOT cover preventative.  So the charges are owed and we can set up payment plan.

## 2017-03-08 ENCOUNTER — Other Ambulatory Visit: Payer: Self-pay | Admitting: Family Medicine

## 2017-04-06 ENCOUNTER — Other Ambulatory Visit: Payer: Self-pay | Admitting: Family Medicine

## 2017-10-11 ENCOUNTER — Other Ambulatory Visit: Payer: Self-pay

## 2017-10-11 ENCOUNTER — Emergency Department (HOSPITAL_COMMUNITY)
Admission: EM | Admit: 2017-10-11 | Discharge: 2017-10-12 | Disposition: A | Discharge status: Home / Self Care | Payer: Self-pay | Attending: Emergency Medicine | Admitting: Emergency Medicine

## 2017-10-11 ENCOUNTER — Encounter (HOSPITAL_COMMUNITY): Payer: Self-pay | Admitting: Emergency Medicine

## 2017-10-11 ENCOUNTER — Emergency Department (HOSPITAL_COMMUNITY): Payer: Self-pay

## 2017-10-11 DIAGNOSIS — Z79899 Other long term (current) drug therapy: Secondary | ICD-10-CM | POA: Insufficient documentation

## 2017-10-11 DIAGNOSIS — E039 Hypothyroidism, unspecified: Secondary | ICD-10-CM | POA: Insufficient documentation

## 2017-10-11 DIAGNOSIS — R002 Palpitations: Secondary | ICD-10-CM | POA: Insufficient documentation

## 2017-10-11 LAB — BASIC METABOLIC PANEL
Anion gap: 7 (ref 5–15)
BUN: 12 mg/dL (ref 6–20)
CO2: 28 mmol/L (ref 22–32)
Calcium: 9.2 mg/dL (ref 8.9–10.3)
Chloride: 106 mmol/L (ref 101–111)
Creatinine, Ser: 1.22 mg/dL (ref 0.61–1.24)
GFR calc Af Amer: >60 mL/min (ref >60)
Glucose, Bld: 103 mg/dL — ABNORMAL HIGH (ref 65–99)
Potassium: 3.9 mmol/L (ref 3.5–5.1)
SODIUM: 141 mmol/L (ref 135–145)

## 2017-10-11 LAB — CBC
HCT: 44.7 % (ref 39.0–52.0)
Hemoglobin: 15 g/dL (ref 13.0–17.0)
MCH: 29.5 pg (ref 26.0–34.0)
MCHC: 33.6 g/dL (ref 30.0–36.0)
MCV: 87.8 fL (ref 78.0–100.0)
PLATELETS: 261 10*3/uL (ref 150–400)
RBC: 5.09 MIL/uL (ref 4.22–5.81)
RDW: 12.2 % (ref 11.5–15.5)
WBC: 9.3 10*3/uL (ref 4.0–10.5)

## 2017-10-11 LAB — I-STAT TROPONIN, ED: Troponin i, poc: 0 ng/mL (ref 0.00–0.08)

## 2017-10-11 NOTE — ED Notes (Signed)
Patient transported to X-ray 

## 2017-10-11 NOTE — ED Triage Notes (Signed)
Pt states about 1 hour ago he suddenly felt his heart start racing and it last about 10 minutes and then went away. On arrival pt has a normal radial pulses in 70's. Pt denies any pain. Pt denies any sob.

## 2017-10-12 LAB — TSH: TSH: 14.288 u[IU]/mL — AB (ref 0.350–4.500)

## 2017-10-12 LAB — I-STAT TROPONIN, ED: Troponin i, poc: 0 ng/mL (ref 0.00–0.08)

## 2017-10-12 LAB — D-DIMER, QUANTITATIVE (NOT AT ARMC)

## 2017-10-12 MED ORDER — AMLODIPINE BESYLATE 5 MG PO TABS: 5.0000 mg | ORAL_TABLET | Freq: Every day | ORAL | 0 refills | Status: DC

## 2017-10-12 MED ORDER — LEVOTHYROXINE SODIUM 25 MCG PO TABS: 25.0000 ug | ORAL_TABLET | Freq: Every day | ORAL | 0 refills | Status: DC

## 2017-10-12 NOTE — ED Provider Notes (Signed)
[name] EMERGENCY DEPARTMENT Provider Note   CSN: [id number] Arrival date & time: 10/11/17  1427     History   Chief Complaint Chief Complaint  Patient presents with  . Palpitations    HPI Cory Wong is a 62 y.o. male.   patient presents with episode of palpitations and blurry vision that occurred around 2 PM.  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  He denies heavy lifting.  He had minimal p.o. intake throughout this course.  While taking a break in talking to a coworker he developed blurry vision in both of his eyes that lasted for about 10 to 15 seconds.  This resolved.  He denies any headache or double vision.  No focal weakness, numbness or tingling.  No difficulty speaking or difficulty swallowing.  Patient then developed palpitations while he was walking that lasted about 2 minutes he felt his heart beating fast in his chest and a thumping sensation.  Denies chest pain or shortness of breath.  Denies any dizziness, vertigo, lightheadedness.  His blood pressure was checked and it was over 200 and he was told to come to the ED.  Patient states he is not taking any medications now.  He was prescribed Synthroid but stopped this due to insurance reasons.  He also stopped his high blood pressure medications.  He denies any cardiac or pulmonary history.  The history is provided by the patient.  Palpitations   Pertinent negatives include no fever, no numbness, no chest pain, no abdominal pain, no nausea, no vomiting, no headaches, no dizziness, no weakness, no cough and no shortness of breath.    Past Medical History:  Diagnosis Date  . Allergy   . Elevated lipids   . Hemorrhoids   . Peripheral neuropathy   . Seborrhea   . Subclinical hypothyroidism     Patient Active Problem List   Diagnosis Date Noted  . Depression 09/11/2013  . BPH (benign prostatic hyperplasia) 07/21/2012  .  Seborrhea 10/22/2010  . Elevated lipids 10/22/2010  . Hemorrhoids 10/22/2010  . Peripheral neuropathy 10/22/2010  . Subclinical hypothyroidism 10/22/2010    Past Surgical History:  Procedure Laterality Date  . APPENDECTOMY          Home Medications    Prior to Admission medications   Medication Sig Start Date End Date Taking? Authorizing Provider  fish oil-omega-3 fatty acids 1000 MG capsule Take 500 mg by mouth 2 (two) times daily.    Yes [provider]  levothyroxine (SYNTHROID, LEVOTHROID) 25 MCG tablet Take 1 tablet (25 mcg total) by mouth daily before breakfast. Office visit needed 04/06/17  Yes Collie Siad A, MD  Multiple Vitamin (MULTIVITAMIN) tablet Take 1 tablet by mouth daily.   Yes [provider]    Family History No family history on file.  Social History Social History   Tobacco Use  . Smoking status: Never Smoker  . Smokeless tobacco: Never Used  Substance Use Topics  . Alcohol use: No  . Drug use: No     Allergies   Patient has no known allergies.   Review of Systems Review of Systems  Constitutional: Negative for activity change, appetite change and fever.  HENT: Negative for congestion, nosebleeds and sinus pain.   Eyes: Positive for visual disturbance. Negative for photophobia.  Respiratory: Negative for cough, chest tightness and shortness of breath.   Cardiovascular: Positive for palpitations.  Negative for chest pain and leg swelling.  Gastrointestinal: Negative for abdominal pain, nausea and vomiting.  Genitourinary: Negative for dysuria, genital sores, hematuria and testicular pain.  Musculoskeletal: Negative for arthralgias and myalgias.  Skin: Negative for rash.  Neurological: Negative for dizziness, seizures, weakness, numbness and headaches.  Psychiatric/Behavioral: Negative for agitation and confusion.    all other systems are negative except as noted in the HPI and PMH.    Physical Exam Updated Vital  Signs BP (!) 159/106 (BP Location: Left Arm)   Pulse 63   Temp 97.8 F (36.6 C) (Oral)   Resp 15   SpO2 99%   Physical Exam  Constitutional: He is oriented to person, place, and time. He appears well-developed and well-nourished. No distress.  HENT:  Head: Normocephalic and atraumatic.  Mouth/Throat: Oropharynx is clear and moist. No oropharyngeal exudate.  Eyes: Pupils are equal, round, and reactive to light. Conjunctivae and EOM are normal.  Neck: Normal range of motion. Neck supple. No thyromegaly present.  No meningismus.  Cardiovascular: Normal rate, regular rhythm, normal heart sounds and intact distal pulses.  No murmur heard. Pulmonary/Chest: Effort normal and breath sounds normal. No respiratory distress. He exhibits no tenderness.  Abdominal: Soft. There is no tenderness. There is no rebound and no guarding.  Musculoskeletal: Normal range of motion. He exhibits no edema or tenderness.  Neurological: He is alert and oriented to person, place, and time. No cranial nerve deficit. He exhibits normal muscle tone. Coordination normal.  CN 2-12 intact, no ataxia on finger to nose, no nystagmus, 5/5 strength throughout, no pronator drift, Romberg negative, normal gait.   Skin: Skin is warm.  Psychiatric: He has a normal mood and affect. His behavior is normal.  Nursing note and vitals reviewed.    ED Treatments / Results  Labs (all labs ordered are listed, but only abnormal results are displayed) Labs Reviewed  BASIC METABOLIC PANEL - Abnormal; Notable for the following components:      Result Value   Glucose, Bld 103 (*)    All other components within normal limits  TSH - Abnormal; Notable for the following components:   TSH 14.288 (*)    All other components within normal limits  CBC  D-DIMER, QUANTITATIVE (NOT AT [facility])  I-STAT TROPONIN, ED  I-STAT TROPONIN, ED    EKG EKG Interpretation  Date/Time:  Tuesday October 12 2017 00:15:57 EDT Ventricular Rate:  56 PR  Interval:  182 QRS Duration: 100 QT Interval:  415 QTC Calculation: 401 R Axis:   42 Text Interpretation:  Sinus rhythm Borderline prolonged PR interval No significant change was found Confirmed by Glynn Octave [phone]) on 10/12/2017 12:34:12 AM   Radiology Dg Chest 2 View  Result Date: 10/11/2017 CLINICAL DATA:  Intermittent tachycardia EXAM: CHEST - 2 VIEW COMPARISON:  None. FINDINGS: Heart size is normal. Mediastinal shadows are normal. The lungs are clear. No bronchial thickening. No infiltrate, mass, effusion or collapse. Pulmonary vascularity is normal. No bony abnormality. IMPRESSION: Normal chest Electronically Signed   By: Paulina Fusi M.D.   On: 10/11/2017 15:06    Procedures Procedures (including critical care time)  Medications Ordered in ED Medications - No data to display   Initial Impression / Assessment and Plan / ED Course  I have reviewed the triage vital signs and the nursing notes.  Pertinent labs & imaging results that were available during my care of the patient were reviewed by me and considered in my medical decision making (see chart for  details).    Patient presents with episode of palpitations that lasted about 2 minutes and is now resolved.  EKG on arrival was sinus rhythm.  He did have episode of bilateral blurry vision preceding this which is since resolved.  He has no neuro deficits at this time.    States noncompliance with blood pressure and hypothyroid meds.  EKG is reassuring.  Troponin negative x2.  D-dimer negative.  Neurological exam is nonfocal. Patient feels back to baseline.  Low suspicion for ACS or TIA or stroke.  Blood pressure remains slightly elevated.  Patient also has been off of his thyroid medication.  TSH is pending.  Discussed with patient needs to establish care with PCP.May need cardiology evaluation for holter monitor if palpitations recur.   Will restart blood pressure medicine and levothyroxine.  Return precautions  discussed.  Final Clinical Impressions(s) / ED Diagnoses   Final diagnoses:  Palpitations  Hypothyroidism, unspecified type    ED Discharge Orders    None       Eldana Isip, Jeannett Senior, MD 10/12/17 [phone]

## 2017-10-12 NOTE — ED Notes (Signed)
Pt departed in NAD, refused use of wheelchair.  

## 2017-10-12 NOTE — Discharge Instructions (Addendum)
Restart your blood pressure and thyroid medications.  Follow-up with your primary doctor.  You should also see a cardiologist if your palpitations recur.  Return to the ED if you develop chest pain, shortness of breath, unilateral weakness, difficulty speaking difficulty swallowing or any other concerns.-

## 2018-12-11 IMAGING — DX DG CHEST 2V
2 series · 2 of 2 positions shown · non-contrast
Comparison: None.

CLINICAL DATA: Intermittent tachycardia

EXAM:
CHEST - 2 VIEW

[chest pa]
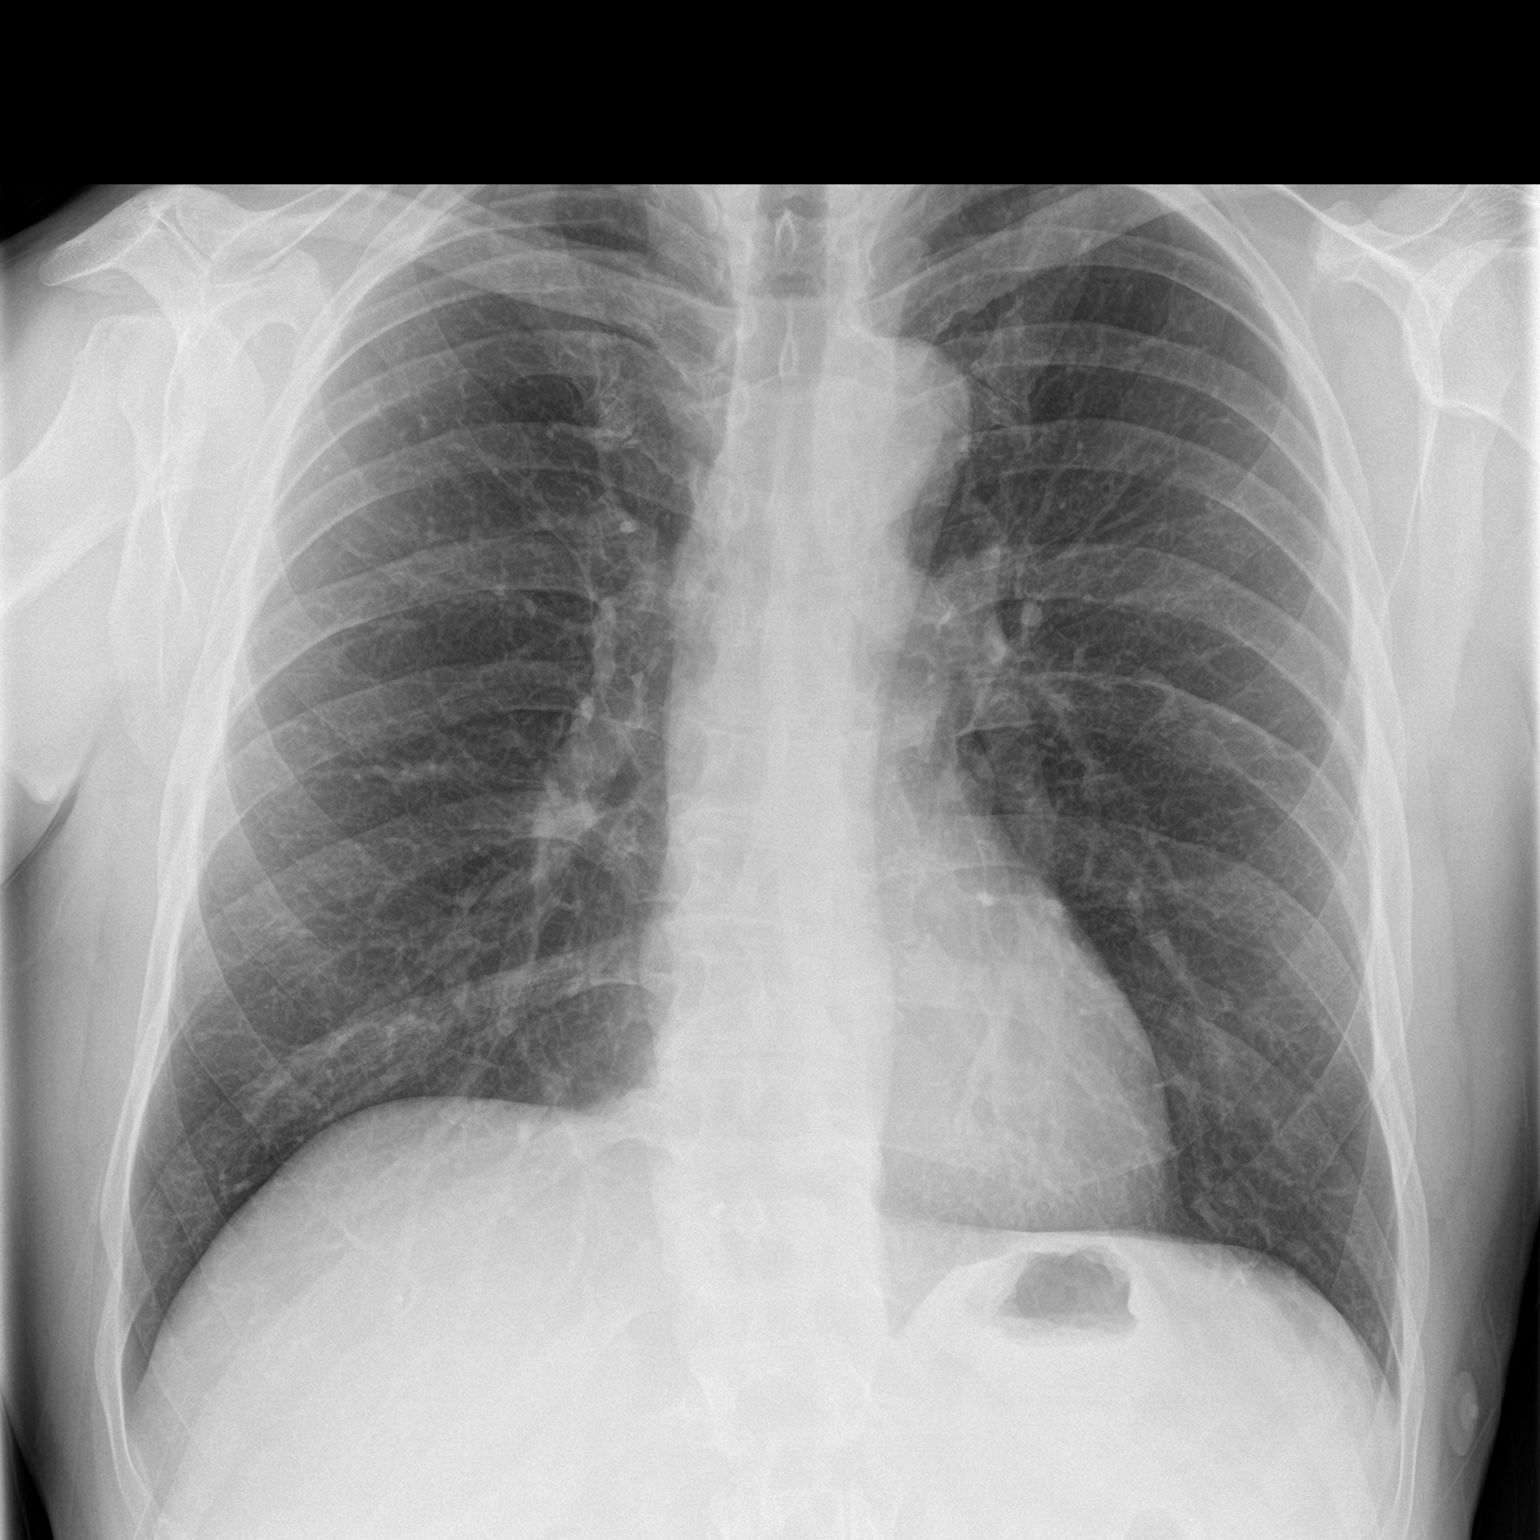

[chest lat]
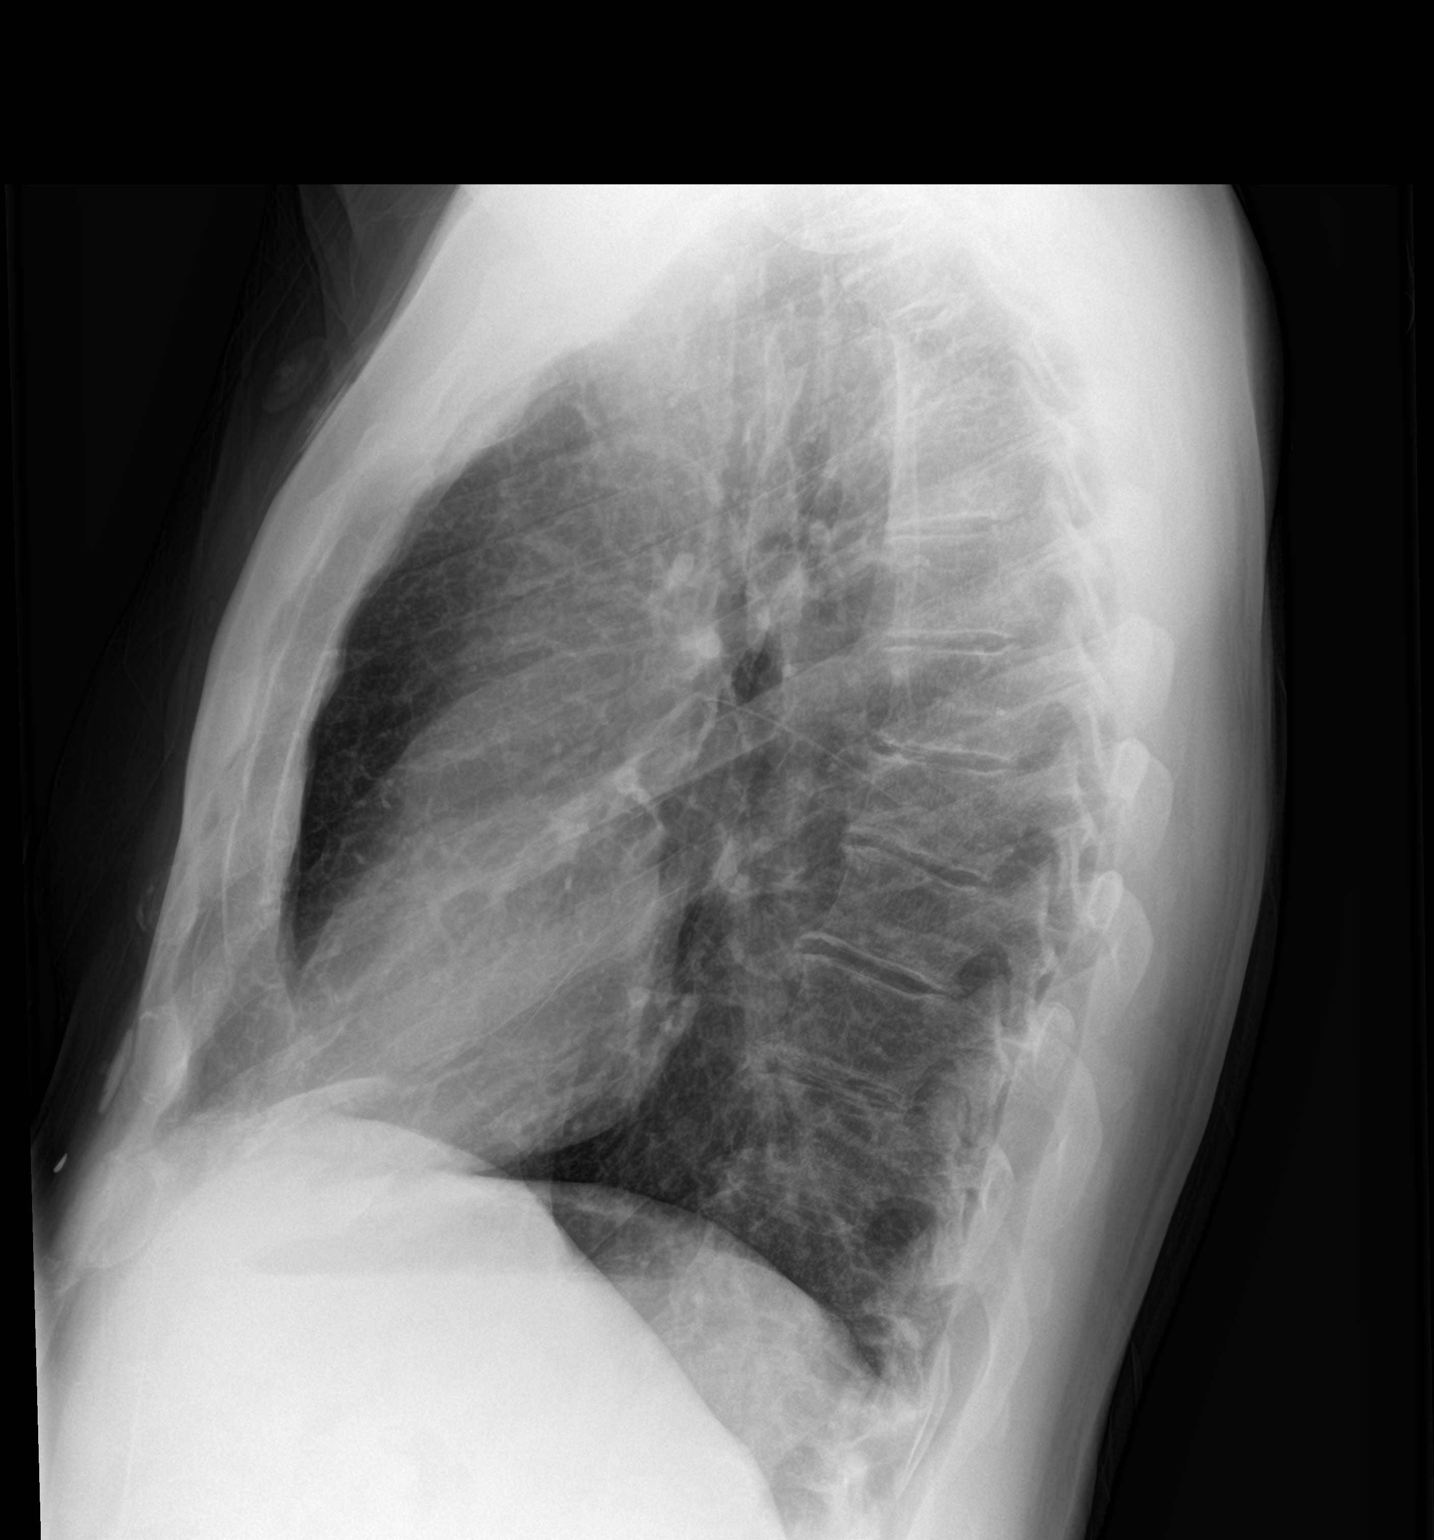

[2 of 2 positions shown; findings below may reference images not displayed]

FINDINGS: Heart size is normal. Mediastinal shadows are normal. The lungs are
clear. No bronchial thickening. No infiltrate, mass, effusion or
collapse. Pulmonary vascularity is normal. No bony abnormality.
IMPRESSION: Normal chest

## 2019-07-26 ENCOUNTER — Inpatient Hospital Stay (HOSPITAL_COMMUNITY)
Admission: EM | Admit: 2019-07-26 | Discharge: 2019-07-28 | DRG: 247 | Disposition: A | Discharge status: Home / Self Care | Payer: Commercial Managed Care - PPO | Attending: Cardiology | Admitting: Cardiology

## 2019-07-26 ENCOUNTER — Encounter (HOSPITAL_COMMUNITY): Payer: Self-pay | Admitting: Emergency Medicine

## 2019-07-26 ENCOUNTER — Other Ambulatory Visit: Payer: Self-pay

## 2019-07-26 ENCOUNTER — Encounter (HOSPITAL_COMMUNITY)
Admission: EM | Disposition: A | Discharge status: Home / Self Care | Payer: Self-pay | Source: Home / Self Care | Attending: Cardiology

## 2019-07-26 DIAGNOSIS — E785 Hyperlipidemia, unspecified: Secondary | ICD-10-CM | POA: Diagnosis present

## 2019-07-26 DIAGNOSIS — Z79899 Other long term (current) drug therapy: Secondary | ICD-10-CM | POA: Diagnosis not present

## 2019-07-26 DIAGNOSIS — I219 Acute myocardial infarction, unspecified: Secondary | ICD-10-CM | POA: Diagnosis present

## 2019-07-26 DIAGNOSIS — E039 Hypothyroidism, unspecified: Secondary | ICD-10-CM | POA: Diagnosis present

## 2019-07-26 DIAGNOSIS — Z20822 Contact with and (suspected) exposure to covid-19: Secondary | ICD-10-CM | POA: Diagnosis present

## 2019-07-26 DIAGNOSIS — I2510 Atherosclerotic heart disease of native coronary artery without angina pectoris: Secondary | ICD-10-CM | POA: Diagnosis present

## 2019-07-26 DIAGNOSIS — I2119 ST elevation (STEMI) myocardial infarction involving other coronary artery of inferior wall: Secondary | ICD-10-CM | POA: Diagnosis present

## 2019-07-26 DIAGNOSIS — Z955 Presence of coronary angioplasty implant and graft: Secondary | ICD-10-CM

## 2019-07-26 DIAGNOSIS — I10 Essential (primary) hypertension: Secondary | ICD-10-CM | POA: Diagnosis present

## 2019-07-26 DIAGNOSIS — I340 Nonrheumatic mitral (valve) insufficiency: Secondary | ICD-10-CM | POA: Diagnosis not present

## 2019-07-26 DIAGNOSIS — Z79890 Hormone replacement therapy (postmenopausal): Secondary | ICD-10-CM

## 2019-07-26 DIAGNOSIS — E782 Mixed hyperlipidemia: Secondary | ICD-10-CM | POA: Diagnosis present

## 2019-07-26 DIAGNOSIS — G629 Polyneuropathy, unspecified: Secondary | ICD-10-CM | POA: Diagnosis present

## 2019-07-26 DIAGNOSIS — N400 Enlarged prostate without lower urinary tract symptoms: Secondary | ICD-10-CM | POA: Diagnosis present

## 2019-07-26 DIAGNOSIS — Z8249 Family history of ischemic heart disease and other diseases of the circulatory system: Secondary | ICD-10-CM | POA: Diagnosis not present

## 2019-07-26 HISTORY — PX: CORONARY STENT INTERVENTION: CATH118234

## 2019-07-26 HISTORY — PX: LEFT HEART CATH AND CORONARY ANGIOGRAPHY: CATH118249

## 2019-07-26 HISTORY — PX: CORONARY/GRAFT ACUTE MI REVASCULARIZATION: CATH118305

## 2019-07-26 HISTORY — PX: CORONARY THROMBECTOMY: CATH118304

## 2019-07-26 HISTORY — DX: Acute myocardial infarction, unspecified: I21.9

## 2019-07-26 LAB — RESPIRATORY PANEL BY RT PCR (FLU A&B, COVID)
Influenza A by PCR: NEGATIVE
Influenza B by PCR: NEGATIVE
SARS Coronavirus 2 by RT PCR: NEGATIVE

## 2019-07-26 SURGERY — CORONARY/GRAFT ACUTE MI REVASCULARIZATION
Anesthesia: LOCAL

## 2019-07-26 MED ORDER — TICAGRELOR 90 MG PO TABS
90.0000 mg | ORAL_TABLET | Freq: Two times a day (BID) | ORAL | Status: DC
  Administered 2019-07-27 – 2019-07-28 (×3): 90 mg via ORAL
  Filled 2019-07-26 (×3): qty 1

## 2019-07-26 MED ORDER — HEPARIN SODIUM (PORCINE) 5000 UNIT/ML IJ SOLN
4000.0000 [IU] | Freq: Once | INTRAMUSCULAR | Status: AC
  Administered 2019-07-26: 4000 [IU] via INTRAVENOUS

## 2019-07-26 MED ORDER — FENTANYL CITRATE (PF) 100 MCG/2ML IJ SOLN
INTRAMUSCULAR | Status: AC
  Filled 2019-07-26: qty 2

## 2019-07-26 MED ORDER — SODIUM CHLORIDE 0.9 % IV SOLN: 250.0000 mL | INTRAVENOUS | Status: DC | PRN

## 2019-07-26 MED ORDER — SODIUM CHLORIDE 0.9 % IV SOLN
INTRAVENOUS | Status: AC | PRN
  Administered 2019-07-26: 10 mL/h via INTRAVENOUS

## 2019-07-26 MED ORDER — ACETAMINOPHEN 325 MG PO TABS: 650.0000 mg | ORAL_TABLET | ORAL | Status: DC | PRN

## 2019-07-26 MED ORDER — SODIUM CHLORIDE 0.9 % IV SOLN
INTRAVENOUS | Status: AC
  Administered 2019-07-27: 800 mL via INTRAVENOUS

## 2019-07-26 MED ORDER — MIDAZOLAM HCL 2 MG/2ML IJ SOLN
INTRAMUSCULAR | Status: AC
  Filled 2019-07-26: qty 2

## 2019-07-26 MED ORDER — METOPROLOL TARTRATE 25 MG PO TABS
25.0000 mg | ORAL_TABLET | Freq: Two times a day (BID) | ORAL | Status: DC
  Administered 2019-07-26 – 2019-07-28 (×4): 25 mg via ORAL
  Filled 2019-07-26 (×4): qty 1

## 2019-07-26 MED ORDER — LIDOCAINE HCL (PF) 1 % IJ SOLN
INTRAMUSCULAR | Status: DC | PRN
  Administered 2019-07-26: 2 mL

## 2019-07-26 MED ORDER — IOHEXOL 350 MG/ML SOLN
INTRAVENOUS | Status: AC
  Filled 2019-07-26: qty 1

## 2019-07-26 MED ORDER — MIDAZOLAM HCL 2 MG/2ML IJ SOLN
INTRAMUSCULAR | Status: DC | PRN
  Administered 2019-07-26: 1 mg via INTRAVENOUS

## 2019-07-26 MED ORDER — HEPARIN SODIUM (PORCINE) 1000 UNIT/ML IJ SOLN
INTRAMUSCULAR | Status: DC | PRN
  Administered 2019-07-26: 5000 [IU] via INTRAVENOUS

## 2019-07-26 MED ORDER — SODIUM CHLORIDE 0.9% FLUSH: 3.0000 mL | INTRAVENOUS | Status: DC | PRN

## 2019-07-26 MED ORDER — LABETALOL HCL 5 MG/ML IV SOLN: 10.0000 mg | INTRAVENOUS | Status: AC | PRN

## 2019-07-26 MED ORDER — HYDRALAZINE HCL 20 MG/ML IJ SOLN: 10.0000 mg | INTRAMUSCULAR | Status: AC | PRN

## 2019-07-26 MED ORDER — ATORVASTATIN CALCIUM 80 MG PO TABS
80.0000 mg | ORAL_TABLET | Freq: Every day | ORAL | Status: DC
  Administered 2019-07-26 – 2019-07-27 (×2): 80 mg via ORAL
  Filled 2019-07-26 (×2): qty 1

## 2019-07-26 MED ORDER — LISINOPRIL 5 MG PO TABS
5.0000 mg | ORAL_TABLET | Freq: Every day | ORAL | Status: DC
  Administered 2019-07-27 – 2019-07-28 (×2): 5 mg via ORAL
  Filled 2019-07-26 (×2): qty 1

## 2019-07-26 MED ORDER — SODIUM CHLORIDE 0.9% FLUSH
3.0000 mL | Freq: Two times a day (BID) | INTRAVENOUS | Status: DC
  Administered 2019-07-26 – 2019-07-27 (×3): 3 mL via INTRAVENOUS

## 2019-07-26 MED ORDER — HEPARIN (PORCINE) IN NACL 1000-0.9 UT/500ML-% IV SOLN
INTRAVENOUS | Status: DC | PRN
  Administered 2019-07-26: 500 mL

## 2019-07-26 MED ORDER — LIDOCAINE HCL (PF) 1 % IJ SOLN
INTRAMUSCULAR | Status: AC
  Filled 2019-07-26: qty 30

## 2019-07-26 MED ORDER — HEPARIN (PORCINE) IN NACL 1000-0.9 UT/500ML-% IV SOLN
INTRAVENOUS | Status: AC
  Filled 2019-07-26: qty 1000

## 2019-07-26 MED ORDER — ASPIRIN 81 MG PO CHEW
81.0000 mg | CHEWABLE_TABLET | Freq: Every day | ORAL | Status: DC
  Administered 2019-07-27 – 2019-07-28 (×2): 81 mg via ORAL
  Filled 2019-07-26 (×2): qty 1

## 2019-07-26 MED ORDER — TICAGRELOR 90 MG PO TABS
180.0000 mg | ORAL_TABLET | Freq: Once | ORAL | Status: AC
  Administered 2019-07-26: 180 mg via ORAL

## 2019-07-26 MED ORDER — CHLORHEXIDINE GLUCONATE CLOTH 2 % EX PADS: 6.0000 | MEDICATED_PAD | Freq: Every day | CUTANEOUS | Status: DC

## 2019-07-26 MED ORDER — ONDANSETRON HCL 4 MG/2ML IJ SOLN: 4.0000 mg | Freq: Four times a day (QID) | INTRAMUSCULAR | Status: DC | PRN

## 2019-07-26 MED ORDER — VERAPAMIL HCL 2.5 MG/ML IV SOLN
INTRAVENOUS | Status: AC
  Filled 2019-07-26: qty 2

## 2019-07-26 MED ORDER — HEPARIN SODIUM (PORCINE) 1000 UNIT/ML IJ SOLN
INTRAMUSCULAR | Status: AC
  Filled 2019-07-26: qty 1

## 2019-07-26 MED ORDER — IOHEXOL 350 MG/ML SOLN
INTRAVENOUS | Status: DC | PRN
  Administered 2019-07-26: 140 mL

## 2019-07-26 MED ORDER — VERAPAMIL HCL 2.5 MG/ML IV SOLN
INTRAVENOUS | Status: DC | PRN
  Administered 2019-07-26: 10 mL via INTRA_ARTERIAL

## 2019-07-26 MED ORDER — FENTANYL CITRATE (PF) 100 MCG/2ML IJ SOLN
INTRAMUSCULAR | Status: DC | PRN
  Administered 2019-07-26: 25 ug via INTRAVENOUS

## 2019-07-26 SURGICAL SUPPLY — 21 items
BALLN SAPPHIRE 2.5X12 (BALLOONS) ×2
BALLN SAPPHIRE ~~LOC~~ 4.0X18 (BALLOONS) ×1 IMPLANT
BALLOON SAPPHIRE 2.5X12 (BALLOONS) IMPLANT
CATH EXTRAC PRONTO LP 6F RND (CATHETERS) ×1 IMPLANT
CATH INFINITI 5 FR JL3.5 (CATHETERS) ×1 IMPLANT
CATH LAUNCHER 6FR JR4 (CATHETERS) ×1 IMPLANT
CATH OPTITORQUE TIG 4.0 5F (CATHETERS) ×1 IMPLANT
DEVICE RAD COMP TR BAND LRG (VASCULAR PRODUCTS) ×1 IMPLANT
GLIDESHEATH SLEND SS 6F .021 (SHEATH) ×1 IMPLANT
GUIDEWIRE INQWIRE 1.5J.035X260 (WIRE) IMPLANT
INQWIRE 1.5J .035X260CM (WIRE) ×2
KIT ENCORE 26 ADVANTAGE (KITS) ×1 IMPLANT
KIT HEART LEFT (KITS) ×2 IMPLANT
KIT HEMO VALVE WATCHDOG (MISCELLANEOUS) ×1 IMPLANT
PACK CARDIAC CATHETERIZATION (CUSTOM PROCEDURE TRAY) ×2 IMPLANT
STENT RESOLUTE ONYX 3.5X15 (Permanent Stent) ×1 IMPLANT
STENT RESOLUTE ONYX 3.5X38 (Permanent Stent) ×1 IMPLANT
STENT RESOLUTE ONYX 3.5X8 (Permanent Stent) ×1 IMPLANT
TRANSDUCER W/STOPCOCK (MISCELLANEOUS) ×2 IMPLANT
TUBING CIL FLEX 10 FLL-RA (TUBING) ×2 IMPLANT
WIRE RUNTHROUGH .014X180CM (WIRE) ×1 IMPLANT

## 2019-07-26 NOTE — ED Provider Notes (Signed)
[facility] EMERGENCY DEPARTMENT Provider Note   CSN: [id number] Arrival date & time: 07/26/19  2126     History Chief Complaint  Patient presents with  . Code STEMI    Cory Wong is a 64 y.o. male.  Patient is a 64 year old male who presents as a code STEMI.  He was brought in by [facility].  He reports chest pain that started about 8:00 this evening.  He had some associated shortness of breath.  He says the pain is subsided but now he only has some neck pain.  He took 325 mg of aspirin prior to arrival and EMS gave him morphine which improved his chest pain.  He denies any prior history of known coronary artery disease or other cardiac issues other than he had an episode of palpitations one time in the past.        Past Medical History:  Diagnosis Date  . Allergy   . Elevated lipids   . Hemorrhoids   . Peripheral neuropathy   . Seborrhea   . Subclinical hypothyroidism     Patient Active Problem List   Diagnosis Date Noted  . Depression 09/11/2013  . BPH (benign prostatic hyperplasia) 07/21/2012  . Seborrhea 10/22/2010  . Elevated lipids 10/22/2010  . Hemorrhoids 10/22/2010  . Peripheral neuropathy 10/22/2010  . Subclinical hypothyroidism 10/22/2010    Past Surgical History:  Procedure Laterality Date  . APPENDECTOMY         No family history on file.  Social History   Tobacco Use  . Smoking status: Never Smoker  . Smokeless tobacco: Never Used  Substance Use Topics  . Alcohol use: No  . Drug use: No    Home Medications Prior to Admission medications   Medication Sig Start Date End Date Taking? Authorizing Provider  amLODipine (NORVASC) 5 MG tablet Take 1 tablet (5 mg total) by mouth daily. 10/12/17   Rancour, Jeannett Senior, MD  fish oil-omega-3 fatty acids 1000 MG capsule Take 500 mg by mouth 2 (two) times daily.     [provider]  levothyroxine (SYNTHROID, LEVOTHROID) 25 MCG tablet Take 1 tablet (25 mcg total)  by mouth daily before breakfast. Office visit needed 10/12/17   Rancour, Jeannett Senior, MD  Multiple Vitamin (MULTIVITAMIN) tablet Take 1 tablet by mouth daily.    [provider]    Allergies    Patient has no known allergies.  Review of Systems   Review of Systems  Constitutional: Negative for chills, diaphoresis, fatigue and fever.  HENT: Negative for congestion, rhinorrhea and sneezing.   Eyes: Negative.   Respiratory: Positive for shortness of breath. Negative for cough and chest tightness.   Cardiovascular: Positive for chest pain. Negative for leg swelling.  Gastrointestinal: Negative for abdominal pain, blood in stool, diarrhea, nausea and vomiting.  Genitourinary: Negative for difficulty urinating, flank pain, frequency and hematuria.  Musculoskeletal: Negative for arthralgias and back pain.  Skin: Negative for rash.  Neurological: Negative for dizziness, speech difficulty, weakness, numbness and headaches.    Physical Exam Updated Vital Signs There were no vitals taken for this visit.  Physical Exam Constitutional:      Appearance: He is well-developed.  HENT:     Head: Normocephalic and atraumatic.  Eyes:     Pupils: Pupils are equal, round, and reactive to light.  Cardiovascular:     Rate and Rhythm: Normal rate and regular rhythm.     Heart sounds: Normal heart sounds.  Pulmonary:  Effort: Pulmonary effort is normal. No respiratory distress.     Breath sounds: Normal breath sounds. No wheezing or rales.  Chest:     Chest wall: No tenderness.  Abdominal:     General: Bowel sounds are normal.     Palpations: Abdomen is soft.     Tenderness: There is no abdominal tenderness. There is no guarding or rebound.  Musculoskeletal:        General: Normal range of motion.     Cervical back: Normal range of motion and neck supple.  Lymphadenopathy:     Cervical: No cervical adenopathy.  Skin:    General: Skin is warm and dry.     Findings: No rash.    Neurological:     Mental Status: He is alert and oriented to person, place, and time.     ED Results / Procedures / Treatments   Labs (all labs ordered are listed, but only abnormal results are displayed) Labs Reviewed  RESPIRATORY PANEL BY RT PCR (FLU A&B, COVID)    EKG EKG Interpretation  Date/Time:  Wednesday July 26 2019 21:33:10 EDT Ventricular Rate:  54 PR Interval:    QRS Duration: 82 QT Interval:  416 QTC Calculation: 368 R Axis:   65 Text Interpretation: Sinus bradycardia Multiple premature complexes, vent & supraven Inferior infarct, acute (RCA) ST elevation, consider anterior injury Lateral leads are also involved Probable RV involvement, suggest recording right precordial leads >>> Acute MI <<< Confirmed by Malvin Johns [phone]) on 07/26/2019 9:52:34 PM   Radiology No results found.  Procedures Procedures (including critical care time)  Medications Ordered in ED Medications  lidocaine (PF) (XYLOCAINE) 1 % injection (2 mLs  Given 07/26/19 2150)  Radial Cocktail/Verapamil only (10 mLs Intra-arterial Given 07/26/19 2151)  Heparin (Porcine) in NaCl 1000-0.9 UT/500ML-% SOLN (500 mLs  Given 07/26/19 2153)  Heparin (Porcine) in NaCl 1000-0.9 UT/500ML-% SOLN (500 mLs  Given 07/26/19 2153)  ticagrelor (BRILINTA) tablet 180 mg (180 mg Oral Given 07/26/19 2134)  heparin injection 4,000 Units (4,000 Units Intravenous Given 07/26/19 2135)    ED Course  I have reviewed the triage vital signs and the nursing notes.  Pertinent labs & imaging results that were available during my care of the patient were reviewed by me and considered in my medical decision making (see chart for details).    MDM Rules/Calculators/A&P                      Patient was briefly in the ED while awaiting the Cath Lab.  He got heparin and Brilinta.  He was taken to the Cath Lab for further treatment.  Cardiology was at bedside.  CRITICAL CARE Performed by: Malvin Johns Total critical care time:  15 minutes Critical care time was exclusive of separately billable procedures and treating other patients. Critical care was necessary to treat or prevent imminent or life-threatening deterioration. Critical care was time spent personally by me on the following activities: development of treatment plan with patient and/or surrogate as well as nursing, discussions with consultants, evaluation of patient's response to treatment, examination of patient, obtaining history from patient or surrogate, ordering and performing treatments and interventions, ordering and review of laboratory studies, ordering and review of radiographic studies, pulse oximetry and re-evaluation of patient's condition.  Final Clinical Impression(s) / ED Diagnoses Final diagnoses:  Acute ST elevation myocardial infarction (STEMI) involving other coronary artery of inferior wall ([place])    Rx / DC Orders ED Discharge Orders  None       Rolan Bucco, MD 07/26/19 2157

## 2019-07-26 NOTE — ED Notes (Signed)
Pt given 180mg  brilinta and 4000units heparin given per verbal order cards.

## 2019-07-26 NOTE — ED Triage Notes (Addendum)
Pt BIB [facility] from home, code STEMI activated in the field. C/o shortness of breath and chest pain that started at 2015 tonight. Pt states pain is now radiating to his neck. A&O x 4. Pt took 324mg  asa prior to EMS arrival.

## 2019-07-27 ENCOUNTER — Inpatient Hospital Stay (HOSPITAL_COMMUNITY): Payer: Commercial Managed Care - PPO

## 2019-07-27 ENCOUNTER — Encounter (HOSPITAL_COMMUNITY): Payer: Self-pay | Admitting: Cardiology

## 2019-07-27 DIAGNOSIS — I340 Nonrheumatic mitral (valve) insufficiency: Secondary | ICD-10-CM

## 2019-07-27 LAB — LIPID PANEL
Cholesterol: 203 mg/dL — ABNORMAL HIGH (ref 0–200)
HDL: 33 mg/dL — ABNORMAL LOW (ref >40)
LDL Cholesterol: 120 mg/dL — ABNORMAL HIGH (ref 0–99)
Total CHOL/HDL Ratio: 6.2 RATIO
Triglycerides: 249 mg/dL — ABNORMAL HIGH (ref <150)
VLDL: 50 mg/dL — ABNORMAL HIGH (ref 0–40)

## 2019-07-27 LAB — CBC
HCT: 44.5 % (ref 39.0–52.0)
Hemoglobin: 14.8 g/dL (ref 13.0–17.0)
MCH: 30.6 pg (ref 26.0–34.0)
MCHC: 33.3 g/dL (ref 30.0–36.0)
MCV: 91.9 fL (ref 80.0–100.0)
Platelets: 233 10*3/uL (ref 150–400)
RBC: 4.84 MIL/uL (ref 4.22–5.81)
RDW: 12.5 % (ref 11.5–15.5)
WBC: 10.2 10*3/uL (ref 4.0–10.5)
nRBC: 0 % (ref 0.0–0.2)

## 2019-07-27 LAB — COMPREHENSIVE METABOLIC PANEL
ALT: 49 U/L — ABNORMAL HIGH (ref 0–44)
AST: 150 U/L — ABNORMAL HIGH (ref 15–41)
Albumin: 3.8 g/dL (ref 3.5–5.0)
Alkaline Phosphatase: 51 U/L (ref 38–126)
Anion gap: 10 (ref 5–15)
BUN: 10 mg/dL (ref 8–23)
CO2: 26 mmol/L (ref 22–32)
Calcium: 9.2 mg/dL (ref 8.9–10.3)
Chloride: 102 mmol/L (ref 98–111)
Creatinine, Ser: 0.98 mg/dL (ref 0.61–1.24)
GFR calc Af Amer: >60 mL/min (ref >60)
GFR calc non Af Amer: >60 mL/min (ref >60)
Glucose, Bld: 127 mg/dL — ABNORMAL HIGH (ref 70–99)
Potassium: 4.8 mmol/L (ref 3.5–5.1)
Sodium: 138 mmol/L (ref 135–145)
Total Bilirubin: 0.6 mg/dL (ref 0.3–1.2)
Total Protein: 6.5 g/dL (ref 6.5–8.1)

## 2019-07-27 LAB — ECHOCARDIOGRAM COMPLETE
Height: 73 in
Weight: 3125.24 oz

## 2019-07-27 LAB — POCT I-STAT, CHEM 8
BUN: 16 mg/dL (ref 8–23)
Calcium, Ion: 1.24 mmol/L (ref 1.15–1.40)
Chloride: 104 mmol/L (ref 98–111)
Creatinine, Ser: 0.9 mg/dL (ref 0.61–1.24)
Glucose, Bld: 161 mg/dL — ABNORMAL HIGH (ref 70–99)
HCT: 44 % (ref 39.0–52.0)
Hemoglobin: 15 g/dL (ref 13.0–17.0)
Potassium: 3.5 mmol/L (ref 3.5–5.1)
Sodium: 140 mmol/L (ref 135–145)
TCO2: 24 mmol/L (ref 22–32)

## 2019-07-27 LAB — MRSA PCR SCREENING: MRSA by PCR: NEGATIVE

## 2019-07-27 LAB — HEMOGLOBIN A1C
Hgb A1c MFr Bld: 5.6 % (ref 4.8–5.6)
Mean Plasma Glucose: 114.02 mg/dL

## 2019-07-27 LAB — POCT ACTIVATED CLOTTING TIME: Activated Clotting Time: 417 seconds

## 2019-07-27 LAB — TROPONIN I (HIGH SENSITIVITY): Troponin I (High Sensitivity): >27000 ng/L (ref <18)

## 2019-07-27 LAB — MAGNESIUM: Magnesium: 2 mg/dL (ref 1.7–2.4)

## 2019-07-27 MED ORDER — TAMSULOSIN HCL 0.4 MG PO CAPS
0.4000 mg | ORAL_CAPSULE | Freq: Every day | ORAL | Status: DC
  Administered 2019-07-27 – 2019-07-28 (×2): 0.4 mg via ORAL
  Filled 2019-07-27 (×2): qty 1

## 2019-07-27 MED ORDER — PANTOPRAZOLE SODIUM 40 MG PO TBEC
40.0000 mg | DELAYED_RELEASE_TABLET | Freq: Every day | ORAL | Status: DC
  Administered 2019-07-27 – 2019-07-28 (×2): 40 mg via ORAL
  Filled 2019-07-27 (×2): qty 1

## 2019-07-27 NOTE — Progress Notes (Signed)
CARDIAC REHAB PHASE I   PRE:  Rate/Rhythm: 70 SR    BP: sitting 119/91    SaO2: 98 RA  MODE:  Ambulation: 370 ft   POST:  Rate/Rhythm: 81 SR    BP: sitting 146/105     SaO2: 100 RA  Tolerated well, no c/o. BP elevated. Discussed MI, stents, Brilinta, diet, exercise, NTG, and CRPII.  Receptive, talkative. He likes to cycle. Encouraged walking and CRPII and eventually longer cycling. Understands the importance of Brilinta. Sts he has insurance. Will refer to [place] CRPII.  [id number]  Harriet Masson CES, ACSM 07/27/2019 3:10 PM

## 2019-07-27 NOTE — H&P (Signed)
Cory Wong is an 64 y.o. male.   Chief Complaint: Chest pain HPI:   64 y.o. Caucasian male with family h/o early CAD, presented with chest pain. Patient had one episode of shortness of breath after biking 5 miles, over the last weekend. On 3/24, he had off an on chest pain since 5 PM. Finally, he presented to the ED around 8:30 PM. EKG showed inferior ST elevation with reciprocal changes in anterior leads. He was thus emergently taken to cath lab.   Cath showed culprit RCA stenosis, successfully treated with primary PCI, details below.   Past Medical History:  Diagnosis Date  . Allergy   . Elevated lipids   . Hemorrhoids   . Peripheral neuropathy   . Seborrhea   . Subclinical hypothyroidism     Past Surgical History:  Procedure Laterality Date  . APPENDECTOMY      Family History  Problem Relation Age of Onset  . Heart attack Brother    Social History:  reports that he has never smoked. He has never used smokeless tobacco. He reports that he does not drink alcohol or use drugs.  Allergies: No Known Allergies  Review of Systems  Constitution: Negative for decreased appetite, malaise/fatigue, weight gain and weight loss.  HENT: Negative for congestion.   Eyes: Negative for visual disturbance.  Cardiovascular: Positive for chest pain. Negative for dyspnea on exertion, leg swelling, palpitations and syncope.  Respiratory: Negative for cough.   Endocrine: Negative for cold intolerance.  Hematologic/Lymphatic: Does not bruise/bleed easily.  Skin: Negative for itching and rash.  Musculoskeletal: Negative for myalgias.  Gastrointestinal: Negative for abdominal pain, nausea and vomiting.  Genitourinary: Negative for dysuria.  Neurological: Negative for dizziness and weakness.  Psychiatric/Behavioral: The patient is not nervous/anxious.   All other systems reviewed and are negative.    Blood pressure 129/86, pulse 70, temperature 98 F (36.7 C), temperature source Oral,  resp. rate 14, SpO2 99 %. There is no height or weight on file to calculate BMI.  Physical Exam  Constitutional: He is oriented to person, place, and time. He appears well-developed and well-nourished. No distress.  HENT:  Head: Normocephalic and atraumatic.  Eyes: Pupils are equal, round, and reactive to light. Conjunctivae are normal.  Neck: No JVD present.  Cardiovascular: Normal rate, regular rhythm and intact distal pulses.  No murmur heard. Pulmonary/Chest: Effort normal and breath sounds normal. He has no wheezes. He has no rales.  Abdominal: Soft. Bowel sounds are normal. There is no rebound.  Musculoskeletal:        General: No edema.  Lymphadenopathy:    He has no cervical adenopathy.  Neurological: He is alert and oriented to person, place, and time. No cranial nerve deficit.  Skin: Skin is warm and dry.  Psychiatric: He has a normal mood and affect.  Nursing note and vitals reviewed.   Results for orders placed or performed during the hospital encounter of 07/26/19 (from the past 48 hour(s))  Respiratory Panel by RT PCR (Flu A&B, Covid) - Nasopharyngeal Swab     Status: None   Collection Time: 07/26/19  9:31 PM   Specimen: Nasopharyngeal Swab  Result Value Ref Range   SARS Coronavirus 2 by RT PCR NEGATIVE NEGATIVE    Comment: (NOTE) SARS-CoV-2 target nucleic acids are NOT DETECTED. The SARS-CoV-2 RNA is generally detectable in upper respiratoy specimens during the acute phase of infection. The lowest concentration of SARS-CoV-2 viral copies this assay can detect is 131 copies/mL. A negative result  does not preclude SARS-Cov-2 infection and should not be used as the sole basis for treatment or other patient management decisions. A negative result may occur with  improper specimen collection/handling, submission of specimen other than nasopharyngeal swab, presence of viral mutation(s) within the areas targeted by this assay, and inadequate number of viral copies (<131  copies/mL). A negative result must be combined with clinical observations, patient history, and epidemiological information. The expected result is Negative. Fact Sheet for Patients:  PinkCheek.be Fact Sheet for Healthcare Providers:  GravelBags.it This test is not yet ap proved or cleared by the [place] FDA and  has been authorized for detection and/or diagnosis of SARS-CoV-2 by FDA under an Emergency Use Authorization (EUA). This EUA will remain  in effect (meaning this test can be used) for the duration of the COVID-19 declaration under Section 564(b)(1) of the Act, 21 U.S.C. section 360bbb-3(b)(1), unless the authorization is terminated or revoked sooner.    Influenza A by PCR NEGATIVE NEGATIVE   Influenza B by PCR NEGATIVE NEGATIVE    Comment: (NOTE) The Xpert Xpress SARS-CoV-2/FLU/RSV assay is intended as an aid in  the diagnosis of influenza from Nasopharyngeal swab specimens and  should not be used as a sole basis for treatment. Nasal washings and  aspirates are unacceptable for Xpert Xpress SARS-CoV-2/FLU/RSV  testing. Fact Sheet for Patients: PinkCheek.be Fact Sheet for Healthcare Providers: GravelBags.it This test is not yet approved or cleared by the [place] FDA and  has been authorized for detection and/or diagnosis of SARS-CoV-2 by  FDA under an Emergency Use Authorization (EUA). This EUA will remain  in effect (meaning this test can be used) for the duration of the  Covid-19 declaration under Section 564(b)(1) of the Act, 21  U.S.C. section 360bbb-3(b)(1), unless the authorization is  terminated or revoked. Performed at [facility], [place] [address], [place], [place] [address]   MRSA PCR Screening     Status: None   Collection Time: 07/26/19 11:31 PM   Specimen: Nasopharyngeal  Result Value Ref Range   MRSA by PCR NEGATIVE NEGATIVE     Comment:        The GeneXpert MRSA Assay (FDA approved for NASAL specimens only), is one component of a comprehensive MRSA colonization surveillance program. It is not intended to diagnose MRSA infection nor to guide or monitor treatment for MRSA infections. Performed at [facility], [place] [address] [place], [place] [address]     Labs:   Lab Results  Component Value Date   WBC 9.3 10/11/2017   HGB 15.0 10/11/2017   HCT 44.7 10/11/2017   MCV 87.8 10/11/2017   PLT 261 10/11/2017   No results for input(s): NA, K, CL, CO2, BUN, CREATININE, CALCIUM, PROT, BILITOT, ALKPHOS, ALT, AST, GLUCOSE in the last 168 hours.  Invalid input(s): LABALBU   HEMOGLOBIN A1C Lab Results  Component Value Date   HGBA1C 5.7 12/09/2016     Medications Prior to Admission  Medication Sig Dispense Refill  . amLODipine (NORVASC) 5 MG tablet Take 1 tablet (5 mg total) by mouth daily. 30 tablet 0  . fish oil-omega-3 fatty acids 1000 MG capsule Take 500 mg by mouth 2 (two) times daily.     Marland Kitchen levothyroxine (SYNTHROID, LEVOTHROID) 25 MCG tablet Take 1 tablet (25 mcg total) by mouth daily before breakfast. Office visit needed 30 tablet 0  . Multiple Vitamin (MULTIVITAMIN) tablet Take 1 tablet by mouth daily.        Current  Facility-Administered Medications:  .  0.9 %  sodium chloride infusion, , Intravenous, Continuous, Dajsha Massaro J, MD, Last Rate: 100 mL/hr at 07/27/19 0100, Rate Verify at 07/27/19 0100 .  0.9 %  sodium chloride infusion, 250 mL, Intravenous, PRN, Jayin Derousse J, MD .  acetaminophen (TYLENOL) tablet 650 mg, 650 mg, Oral, Q4H PRN, Matan Steen J, MD .  aspirin chewable tablet 81 mg, 81 mg, Oral, Daily, Brandell Maready J, MD .  atorvastatin (LIPITOR) tablet 80 mg, 80 mg, Oral, q1800, Tamella Tuccillo J, MD, 80 mg at 07/26/19 2350 .  Chlorhexidine Gluconate Cloth 2 % PADS 6 each, 6 each, Topical, Daily, Alixandrea Milleson J, MD .  hydrALAZINE  (APRESOLINE) injection 10 mg, 10 mg, Intravenous, Q20 Min PRN, Yovana Scogin J, MD .  labetalol (NORMODYNE) injection 10 mg, 10 mg, Intravenous, Q10 min PRN, Eiley Mcginnity J, MD, 10 mg at 07/27/19 0102 .  lisinopril (ZESTRIL) tablet 5 mg, 5 mg, Oral, Daily, Yesenia Fontenette J, MD .  metoprolol tartrate (LOPRESSOR) tablet 25 mg, 25 mg, Oral, BID, Janah Mcculloh J, MD, 25 mg at 07/26/19 2350 .  ondansetron (ZOFRAN) injection 4 mg, 4 mg, Intravenous, Q6H PRN, Yer Olivencia J, MD .  sodium chloride flush (NS) 0.9 % injection 3 mL, 3 mL, Intravenous, Q12H, Nazia Rhines J, MD, 3 mL at 07/26/19 2343 .  sodium chloride flush (NS) 0.9 % injection 3 mL, 3 mL, Intravenous, PRN, Charisa Twitty J, MD .  ticagrelor (BRILINTA) tablet 90 mg, 90 mg, Oral, BID, Angelin Cutrone J, MD   Today's Vitals   07/27/19 0215 07/27/19 0230 07/27/19 0245 07/27/19 0300  BP: 131/83 130/84 (!) 127/92 129/86  Pulse: 67 66 71 70  Resp: 12 12 12 14   Temp:      TempSrc:      SpO2: 96% 97% 98% 99%  PainSc:       There is no height or weight on file to calculate BMI.    CARDIAC STUDIES:  Coronary angiography/intervention 07/26/2019: LM: Distal 20% stenosis LAD: Minimal luminal irregularities LCx: Normal RCA: Prox 100%, mid 75%, distal 50% stenosis        Aspiration thrombectomy        Successful percutaneous coronary intervention mid to ostial RCA        PTCA and overlapping stents placement.        Resolute Onyx 3.5 X 8 mm DES        Resolute Onyx 3.5 X 38 mm DES        Resolute Onyx 3.5 X 15 mm DES        Post dilatation with 4.0X15 mm [place] balloon  0% residual stenosis, TIMI III flow.  EKG 07/26/2019: Sinus rhythm. Inferior ST elevation. Anterior reciprocal ST depression  Echocardiogram pending:   Assessment/Plan  64 y.o. Caucasian male with family h/o early CAD, with inferior STEMI  STEMI: RCA: Prox 100%, mid 75%, distal 50% stenosis        Aspiration thrombectomy         Successful percutaneous coronary intervention mid to ostial RCA        PTCA and overlapping stents placement.        Resolute Onyx 3.5 X 8 mm DES        Resolute Onyx 3.5 X 38 mm DES        Resolute Onyx 3.5 X 15 mm DES        Post dilatation with 4.0X15 mm [place] balloon  DAPT with Aspirin/Brilinta Started  metoprolol tartarate 25 mg bid. Add lisinopril 5 mg on 3/25 morning. Echocardiogram pending.  Check lipid panel, lipoprotein (a) given family h/o CAD.   Elder Negus, MD 07/27/2019, 3:12 AM [facility] Pager: [phone] Office: [phone] If no answer: [phone]

## 2019-07-27 NOTE — Progress Notes (Addendum)
TR band removed per protocol. Slight bruising noted right above TR band site, 2 inches up from insertion site in which pt reports he had a previous bruise. No hematoma, firmness, or bleeding noted. Level 0. Pt denies numbness, pain, and tingling, is able to move hand, and was educated on keeping dressing on for the next 24 hours. No complaints voiced at this time.

## 2019-07-27 NOTE — Care Management (Signed)
[id number] 07-27-19 Case Manager received consult for Eliquis. Benefits check submitted for Eliquis and Brilinta. Case Manager will follow for cost. Graves-Bigelow, Lamar Laundry, RN, BSN Case Manager

## 2019-07-27 NOTE — Plan of Care (Signed)
Pt is alert and oriented, breathing is even and unlabored on room air. Pt remains in NSR in 60s to 80s and MAP has overall decreased from 110s to 90s. Pt is able to ambulate to the bathroom independently with IV pole set up. Pt reports difficulty urinating at times but has been able to void a few different times since admission to the floor. Pt denies chest pain, dizziness, and shortness of breath. Right radial site remains at a level 0 with gauze clean, dry, and intact. No complaints or concerns voiced at this time. Call bell is within reach and bed is in lowest position. Problem: Education: Goal: Knowledge of General Education information will improve Description: Including pain rating scale, medication(s)/side effects and non-pharmacologic comfort measures Outcome: Progressing   Problem: Clinical Measurements: Goal: Cardiovascular complication will be avoided Outcome: Progressing   Problem: Coping: Goal: Level of anxiety will decrease Outcome: Progressing   Problem: Pain Managment: Goal: General experience of comfort will improve Outcome: Progressing

## 2019-07-27 NOTE — Progress Notes (Signed)
Pt being transferred to 6E-20. Report called to receiving RN. Belongings sent with pt-shoes, cell phone, wallet, and shirt.

## 2019-07-27 NOTE — Progress Notes (Signed)
  Echocardiogram 2D Echocardiogram has been performed.  Cory Wong 07/27/2019, 9:27 AM

## 2019-07-28 DIAGNOSIS — I10 Essential (primary) hypertension: Secondary | ICD-10-CM | POA: Diagnosis present

## 2019-07-28 LAB — TSH: TSH: 3.712 u[IU]/mL (ref 0.350–4.500)

## 2019-07-28 MED ORDER — TAMSULOSIN HCL 0.4 MG PO CAPS: 0.4000 mg | ORAL_CAPSULE | Freq: Every day | ORAL | 2 refills | Status: DC

## 2019-07-28 MED ORDER — ASPIRIN 81 MG PO CHEW: 81.0000 mg | CHEWABLE_TABLET | Freq: Every day | ORAL | 2 refills | Status: DC

## 2019-07-28 MED ORDER — TICAGRELOR 90 MG PO TABS: 90.0000 mg | ORAL_TABLET | Freq: Two times a day (BID) | ORAL | 2 refills | Status: DC

## 2019-07-28 MED ORDER — PANTOPRAZOLE SODIUM 40 MG PO TBEC: 40.0000 mg | DELAYED_RELEASE_TABLET | Freq: Every day | ORAL | 2 refills | Status: DC

## 2019-07-28 MED ORDER — ATORVASTATIN CALCIUM 80 MG PO TABS: 80.0000 mg | ORAL_TABLET | Freq: Every day | ORAL | 2 refills | Status: DC

## 2019-07-28 MED ORDER — METOPROLOL SUCCINATE ER 50 MG PO TB24: 50.0000 mg | ORAL_TABLET | Freq: Every day | ORAL | 2 refills | Status: DC

## 2019-07-28 MED ORDER — LISINOPRIL 5 MG PO TABS: 5.0000 mg | ORAL_TABLET | Freq: Every day | ORAL | 2 refills | Status: DC

## 2019-07-28 MED FILL — ASPIRIN LOW DOSE 81 MG CHEW: 81 | 30 days supply | Qty: 30 | Fill #0

## 2019-07-28 MED FILL — TAMSULOSIN HCL 0.4 MG CAP: 0.4 | 30 days supply | Qty: 30 | Fill #0

## 2019-07-28 MED FILL — METOPROLOL SUCCINATE ER 50: 50 | 30 days supply | Qty: 30 | Fill #0

## 2019-07-28 MED FILL — BRILINTA 90 MG TABLET: 90 | 30 days supply | Qty: 60 | Fill #0

## 2019-07-28 MED FILL — LISINOPRIL 5 MG TABS: 5 | 30 days supply | Qty: 30 | Fill #0

## 2019-07-28 MED FILL — PANTOPRAZOLE SOD DR 40 MG T: 40 | 30 days supply | Qty: 30 | Fill #0

## 2019-07-28 MED FILL — ATORVASTATIN CALCIUM 80 MG: 80 | 30 days supply | Qty: 30 | Fill #0

## 2019-07-28 NOTE — Discharge Summary (Signed)
Physician Discharge Summary  Patient ID: Cory Wong MRN: [id number] DOB/AGE: 01/17/1956 64 y.o.  Admit date: 07/26/2019 Discharge date: 07/28/2019  Primary Discharge Diagnosis: STEMI  Secondary Discharge Diagnosis: Hypertension Hyperlipidemia Prostate hypertrophy Hypothyroidism Family h/o early CAD Peripheral neuropathy  Hospital Course:   64 y.o. Caucasian male with hypertension, hyperlipidemia, hypothyroidism, BPH, family h/o early CAD, presented with chest pain on 3/24. EKG showed inferior STEMI. Patient underwent primary PCI for occluded prox-mid RCA, details below. Echocardiogram showed preserved EF. He ambulated without chest pain. He was discharged on DAPT, BB, ACEi, high intensity statin. Flomax was started for BPH. Will need to establish care with PCP to follow up on BPH and hypothyroidism. TOC f/u arranged.   Did the patient have an acute coronary syndrome (MI, NSTEMI, STEMI, etc) this admission?:  Yes                               AHA/ACC Clinical Performance & Quality Measures: 1. Aspirin prescribed? - Yes 2. ADP Receptor Inhibitor (Plavix/Clopidogrel, Brilinta/Ticagrelor or Effient/Prasugrel) prescribed (includes medically managed patients)? - Yes 3. Beta Blocker prescribed? - Yes 4. High Intensity Statin (Lipitor 40-80mg  or Crestor 20-40mg ) prescribed? - Yes 5. EF assessed during THIS hospitalization? - Yes 6. For EF <40%, was ACEI/ARB prescribed? - Not Applicable (EF >/= 40%) 7. For EF <40%, Aldosterone Antagonist (Spironolactone or Eplerenone) prescribed? - Not Applicable (EF >/= 40%) 8. Cardiac Rehab Phase II ordered (Included Medically managed Patients)? - Yes   Discharge Exam: Blood pressure 118/71, pulse 68, temperature 98.6 F (37 C), temperature source Oral, resp. rate 19, height 6\' 1"  (1.854 m), weight 81 kg, SpO2 98 %.   Physical Exam  Constitutional: He is oriented to person, place, and time. He appears well-developed and well-nourished. No  distress.  HENT:  Head: Normocephalic and atraumatic.  Eyes: Pupils are equal, round, and reactive to light. Conjunctivae are normal.  Neck: No JVD present.  Cardiovascular: Normal rate, regular rhythm and intact distal pulses.  Pulmonary/Chest: Effort normal and breath sounds normal. He has no wheezes. He has no rales.  Abdominal: Soft. Bowel sounds are normal. There is no rebound.  Musculoskeletal:        General: No edema.  Lymphadenopathy:    He has no cervical adenopathy.  Neurological: He is alert and oriented to person, place, and time. No cranial nerve deficit.  Skin: Skin is warm and dry.  Psychiatric: He has a normal mood and affect.  Nursing note and vitals reviewed.   Significant Diagnostic Studies:  EKG 07/27/2019: Sinus rhythm.  Inferior infarct, age indeterminate  Echocardiogram 07/27/2019: Hypokiinesis of the basal inferior/inferolateral wall with overall normal LV systolic function, EF 55-60%. Normal diastolic function. Normal RV size and function.  Mild aortic sclerosis  Coronary angiography/intervention 07/26/2019: LM: Distal 20% stenosis LAD: Minimal luminal irregularities LCx: Normal RCA: Prox 100%, mid 75%, distal 50% stenosis        Aspiration thrombectomy        Successful percutaneous coronary intervention mid to ostial RCA        PTCA and overlapping stents placement.        Resolute Onyx 3.5 X 8 mm DES        Resolute Onyx 3.5 X 38 mm DES        Resolute Onyx 3.5 X 15 mm DES        Post dilatation with 4.0X15 mm [place] balloon There is slight under expansion  in mid RCA due to calcium. This is <10%.  0% residual stenosis, TIMI III flow.   Labs:   Lab Results  Component Value Date   WBC 10.2 07/27/2019   HGB 14.8 07/27/2019   HCT 44.5 07/27/2019   MCV 91.9 07/27/2019   PLT 233 07/27/2019    Recent Labs  Lab 07/27/19 0507  NA 138  K 4.8  CL 102  CO2 26  BUN 10  CREATININE 0.98  CALCIUM 9.2  PROT 6.5  BILITOT 0.6  ALKPHOS 51  ALT  49*  AST 150*  GLUCOSE 127*    Lipid Panel     Component Value Date/Time   CHOL 203 (H) 07/27/2019 0507   TRIG 249 (H) 07/27/2019 0507   HDL 33 (L) 07/27/2019 0507   CHOLHDL 6.2 07/27/2019 0507   VLDL 50 (H) 07/27/2019 0507   LDLCALC 120 (H) 07/27/2019 0507    BNP (last 3 results) No results for input(s): BNP in the last 8760 hours.  HEMOGLOBIN A1C Lab Results  Component Value Date   HGBA1C 5.6 07/27/2019   MPG 114.02 07/27/2019    Cardiac Panel (last 3 results) No results for input(s): CKTOTAL, CKMB, TROPONINI, RELINDX in the last 8760 hours.  No results found for: CKTOTAL, CKMB, CKMBINDEX, TROPONINI   TSH No results for input(s): TSH in the last 8760 hours.  Radiology: CARDIAC CATHETERIZATION  Addendum Date: 07/27/2019   LM: Distal 20% stenosis LAD: Minimal luminal irregularities LCx: Normal RCA: Prox 100%, mid 75%, distal 50% stenosis        Aspiration thrombectomy        Successful percutaneous coronary intervention mid to ostial RCA        PTCA and overlapping stents placement.        Resolute Onyx 3.5 X 8 mm DES        Resolute Onyx 3.5 X 38 mm DES        Resolute Onyx 3.5 X 15 mm DES        Post dilatation with 4.0X15 mm [place] balloon There is slight under expansion in mid RCA due to calcium. This is <10%. 0% residual stenosis, TIMI III flow. Nigel Mormon, MD [facility] [place] Pager: [phone] Office: [phone]    Result Date: 07/27/2019 LM: Distal 20% stenosis LAD: Minimal luminal irregularities LCx: Normal RCA: Prox 100%, mid 75%, distal 50% stenosis        Aspiration thrombectomy        Successful percutaneous coronary intervention mid to ostial RCA        PTCA and overlapping stents placement.        Resolute Onyx 3.5 X 8 mm DES        Resolute Onyx 3.5 X 38 mm DES        Resolute Onyx 3.5 X 15 mm DES        Post dilatation with 4.0X15 mm [place] balloon 0% residual stenosis, TIMI III flow. Nigel Mormon, MD [facility]. [place] Pager:  [phone] Office: [phone]    ECHOCARDIOGRAM COMPLETE  Result Date: 07/27/2019    ECHOCARDIOGRAM REPORT   Patient Name:   Cory Wong Date of Exam: 07/27/2019 Medical Rec #:  [id number]       Height:       73.0 in Accession #:    [id number]      Weight:       195.3 lb Date of Birth:  23-Aug-1955       BSA:  2.130 m Patient Age:    64 years        BP:           129/87 mmHg Patient Gender: M               HR:           67 bpm. Exam Location:  Inpatient Procedure: 2D Echo, Cardiac Doppler and Color Doppler Indications:    121-121.4 ST elevation (STEMI) myocardial infarction  History:        Patient has no prior history of Echocardiogram examinations.                 Hypothyroidism.  Sonographer:    Elmarie Shiley Dance Referring Phys: [id number] [facility] J Taylynn Easton IMPRESSIONS  1. Hypokiinesis of the basal inferior/inferolateral wall with overall normal LV systolic function; trace AI; mild MR.  2. Left ventricular ejection fraction, by estimation, is 55 to 60%. The left ventricle has normal function. The left ventricle demonstrates regional wall motion abnormalities (see scoring diagram/findings for description). Left ventricular diastolic parameters were normal.  3. Right ventricular systolic function is normal. The right ventricular size is normal.  4. The mitral valve is normal in structure. Mild mitral valve regurgitation. No evidence of mitral stenosis.  5. The aortic valve is tricuspid. Aortic valve regurgitation is trivial. Mild aortic valve sclerosis is present, with no evidence of aortic valve stenosis.  6. The inferior vena cava is normal in size with greater than 50% respiratory variability, suggesting right atrial pressure of 3 mmHg. FINDINGS  Left Ventricle: Left ventricular ejection fraction, by estimation, is 55 to 60%. The left ventricle has normal function. The left ventricle demonstrates regional wall motion abnormalities. The left ventricular internal cavity size was normal in size. There  is no left ventricular hypertrophy. Left ventricular diastolic parameters were normal. Right Ventricle: The right ventricular size is normal. Right ventricular systolic function is normal. Left Atrium: Left atrial size was normal in size. Right Atrium: Right atrial size was normal in size. Pericardium: There is no evidence of pericardial effusion. Mitral Valve: The mitral valve is normal in structure. Normal mobility of the mitral valve leaflets. Mild mitral valve regurgitation. No evidence of mitral valve stenosis. Tricuspid Valve: The tricuspid valve is normal in structure. Tricuspid valve regurgitation is trivial. No evidence of tricuspid stenosis. Aortic Valve: The aortic valve is tricuspid. Aortic valve regurgitation is trivial. Mild aortic valve sclerosis is present, with no evidence of aortic valve stenosis. Pulmonic Valve: The pulmonic valve was normal in structure. Pulmonic valve regurgitation is trivial. No evidence of pulmonic stenosis. Aorta: The aortic root is normal in size and structure. Venous: The inferior vena cava is normal in size with greater than 50% respiratory variability, suggesting right atrial pressure of 3 mmHg. IAS/Shunts: No atrial level shunt detected by color flow Doppler. Additional Comments: Hypokiinesis of the basal inferior/inferolateral wall with overall normal LV systolic function; trace AI; mild MR.  LEFT VENTRICLE PLAX 2D LVIDd:         4.30 cm  Diastology LVIDs:         3.59 cm  LV e' lateral:   10.90 cm/s LV PW:         1.19 cm  LV E/e' lateral: 5.7 LV IVS:        1.09 cm  LV e' medial:    8.49 cm/s LVOT diam:     2.20 cm  LV E/e' medial:  7.3 LV SV:  80 LV SV Index:   38 LVOT Area:     3.80 cm  RIGHT VENTRICLE             IVC RV Basal diam:  3.14 cm     IVC diam: 1.74 cm RV Mid diam:    1.72 cm RV S prime:     12.90 cm/s TAPSE (M-mode): 2.1 cm LEFT ATRIUM             Index       RIGHT ATRIUM           Index LA diam:        4.00 cm 1.88 cm/m  RA Area:     18.60 cm  LA Vol (A2C):   62.7 ml 29.43 ml/m RA Volume:   50.80 ml  23.85 ml/m LA Vol (A4C):   35.9 ml 16.85 ml/m LA Biplane Vol: 48.3 ml 22.67 ml/m  AORTIC VALVE LVOT Vmax:   96.10 cm/s LVOT Vmean:  62.800 cm/s LVOT VTI:    0.211 m  AORTA Ao Root diam: 3.90 cm Ao Asc diam:  3.60 cm MITRAL VALVE MV Area (PHT): 3.17 cm    SHUNTS MV Decel Time: 239 msec    Systemic VTI:  0.21 m MV E velocity: 61.60 cm/s  Systemic Diam: 2.20 cm MV A velocity: 50.00 cm/s MV E/A ratio:  1.23 Olga Millers MD Electronically signed by Olga Millers MD Signature Date/Time: 07/27/2019/12:01:31 PM    Final       FOLLOW UP PLANS AND APPOINTMENTS Discharge Instructions    Amb Referral to Cardiac Rehabilitation   Complete by: As directed    Diagnosis:  Coronary Stents STEMI PTCA     After initial evaluation and assessments completed: Virtual Based Care may be provided alone or in conjunction with Phase 2 Cardiac Rehab based on patient barriers.: Yes   Diet - low sodium heart healthy   Complete by: As directed    Increase activity slowly   Complete by: As directed      Allergies as of 07/28/2019   No Known Allergies     Medication List    STOP taking these medications   amLODipine 5 MG tablet Commonly known as: NORVASC     TAKE these medications   aspirin 81 MG chewable tablet Chew 1 tablet (81 mg total) by mouth daily.   atorvastatin 80 MG tablet Commonly known as: LIPITOR Take 1 tablet (80 mg total) by mouth daily at 6 PM.   levothyroxine 25 MCG tablet Commonly known as: SYNTHROID Take 1 tablet (25 mcg total) by mouth daily before breakfast. Office visit needed   lisinopril 5 MG tablet Commonly known as: ZESTRIL Take 1 tablet (5 mg total) by mouth daily.   metoprolol succinate 50 MG 24 hr tablet Commonly known as: Toprol XL Take 1 tablet (50 mg total) by mouth daily. Take with or immediately following a meal.   multivitamin tablet Take 1 tablet by mouth daily.   pantoprazole 40 MG  tablet Commonly known as: PROTONIX Take 1 tablet (40 mg total) by mouth daily.   tamsulosin 0.4 MG Caps capsule Commonly known as: FLOMAX Take 1 capsule (0.4 mg total) by mouth daily after breakfast.   ticagrelor 90 MG Tabs tablet Commonly known as: BRILINTA Take 1 tablet (90 mg total) by mouth 2 (two) times daily.      Follow-up Information    Elder Negus, MD Follow up on 08/09/2019.   Specialties: Cardiology, Radiology Why: 3:00 PM.  Please arrive by 2:30 Contact information: [address] [place] [address] [phone]            Truett Mainland MD, [facility] Cardiovascular Pager: [phone] Office: [phone] If no answer: [phone]

## 2019-07-28 NOTE — Plan of Care (Signed)

## 2019-07-28 NOTE — Care Management (Signed)
07-28-19 7445 Case Manager provided patient with the additional discount card for Brilinta. No further needs from Case Manager at this time. Eber Hong, BSN Case Manager

## 2019-07-28 NOTE — Progress Notes (Signed)
Pt feeling well, on his way out. Brief review with pt and daughter. They have good understanding.  [phone] Cory Wong CES, ACSM 10:12 AM 07/28/2019

## 2019-07-28 NOTE — TOC Benefit Eligibility Note (Signed)
Transition of Care [facility]) Benefit Eligibility Note    Patient Details  Name: Cory Wong MRN: [id number] Date of Birth: 05-15-1955   Medication/Dose: Brilinta 90 mg po twice daily and Eliquis 2.5-5 mg po twice daily  Covered?: Yes  Tier: 2 Drug  Prescription Coverage Preferred Pharmacy: Any Retail Pharmacy  Spoke with Person/Company/Phone Number:: [employer]/ [phone]  Co-Pay: 35.00 for a 30 Day Supply Retail and 87.00 for a 90 Day Supply Mail Order  Prior Approval: No  Deductible: (No Deductible)  Additional Notes: All information  the same for both Medications    Dorena Bodo Phone Number: 07/28/2019, 8:17 AM

## 2019-07-29 LAB — LIPOPROTEIN A (LPA): Lipoprotein (a): 62 nmol/L — ABNORMAL HIGH (ref <75.0)

## 2019-07-31 ENCOUNTER — Telehealth (HOSPITAL_COMMUNITY): Payer: Self-pay

## 2019-07-31 NOTE — Telephone Encounter (Signed)
Pt insurance is active and benefits verified through [place] $0.00, DED $1,500.00/$1,500.00 met, out of pocket $3,000.00/$1,874.11 met, co-insurance 20%. No pre-authorization required. Nate/[employer], 07/31/2019 @ 2:08PM, PPN#[id number]  Will contact patient to see if he is interested in the Cardiac Rehab Program. If interested, patient will need to complete follow up appt. Once completed, patient will be contacted for scheduling upon review by the RN Navigator.

## 2019-07-31 NOTE — Telephone Encounter (Signed)
Called patient to see if he is interested in the Cardiac Rehab Program. Patient stated he exercising at home and is not interested at this time.  Closed referral.

## 2019-08-01 ENCOUNTER — Telehealth: Payer: Self-pay

## 2019-08-01 NOTE — Telephone Encounter (Signed)
Yes. Okay to take imodium. Stay well hydrated. If he needs a prescription for imodium, you can send 2 mg q12 hr prn, 15 pills no refills.  Thanks MJP

## 2019-08-01 NOTE — Telephone Encounter (Signed)
Spoke with patient, patient voiced understanding. Patient has his on rx on Imodium. No need to send script.

## 2019-08-01 NOTE — Telephone Encounter (Signed)
Spoke with patient via telephone and patient states that he believes his medications are giving him diarrhea. Patient did not specify a specific medication. Patient would like to know if it is safe to take Imodium with the medications he's already prescribed. Please advise. Thanks!

## 2019-08-09 ENCOUNTER — Other Ambulatory Visit: Payer: Self-pay

## 2019-08-09 ENCOUNTER — Ambulatory Visit: Payer: Commercial Managed Care - PPO | Admitting: Cardiology

## 2019-08-09 ENCOUNTER — Encounter: Payer: Self-pay | Admitting: Cardiology

## 2019-08-09 VITALS — BP 129/76 | HR 68 | Temp 97.8°F | Resp 18 | Ht 73.0 in | Wt 177.0 lb

## 2019-08-09 DIAGNOSIS — E782 Mixed hyperlipidemia: Secondary | ICD-10-CM

## 2019-08-09 DIAGNOSIS — I2510 Atherosclerotic heart disease of native coronary artery without angina pectoris: Secondary | ICD-10-CM | POA: Insufficient documentation

## 2019-08-09 DIAGNOSIS — I252 Old myocardial infarction: Secondary | ICD-10-CM | POA: Insufficient documentation

## 2019-08-09 MED ORDER — LISINOPRIL 5 MG PO TABS: 5.0000 mg | ORAL_TABLET | Freq: Every day | ORAL | 3 refills | Status: DC

## 2019-08-09 MED ORDER — METOPROLOL SUCCINATE ER 50 MG PO TB24: 50.0000 mg | ORAL_TABLET | Freq: Every day | ORAL | 3 refills | Status: DC

## 2019-08-09 MED ORDER — ATORVASTATIN CALCIUM 80 MG PO TABS: 80.0000 mg | ORAL_TABLET | Freq: Every day | ORAL | 3 refills | Status: DC

## 2019-08-09 MED ORDER — PANTOPRAZOLE SODIUM 40 MG PO TBEC: 40.0000 mg | DELAYED_RELEASE_TABLET | Freq: Every day | ORAL | 3 refills | Status: DC

## 2019-08-09 MED ORDER — TICAGRELOR 90 MG PO TABS: 90.0000 mg | ORAL_TABLET | Freq: Two times a day (BID) | ORAL | 3 refills | Status: DC

## 2019-08-09 MED ORDER — ASPIRIN 81 MG PO CHEW: 81.0000 mg | CHEWABLE_TABLET | Freq: Every day | ORAL | 3 refills | Status: DC

## 2019-08-09 NOTE — Progress Notes (Signed)
Follow up visit  Subjective:   Cory Wong, male    DOB: 04/04/1956, 64 y.o.   MRN: [id number]   HPI   Chief Complaint  Patient presents with  . Coronary Artery Disease  . Acute myocardial infarction  . Hospitalization Follow-up    2 week   64 y.o. Caucasian male with hypertension, hyperlipidemia, hypothyroidism, BPH, family h/o early CAD, presented with chest pain on 3/24. EKG showed inferior STEMI. Patient underwent primary PCI for occluded prox-mid RCA, details below. Echocardiogram showed preserved EF. He ambulated without chest pain. He was discharged on DAPT, BB, ACEi, high intensity statin. Flomax was started for BPH.   Patient is doing well since hospital discharge. He has made significant changes in his diet and lifestyle. He has reduced salt and red meat intake. He has begun to exercise regularly, including bike riding and regular walking. He denies chest pain, shortness of breath, palpitations, leg edema, orthopnea, PND, TIA/syncope. His only complaint is that he has noticed some lightheadedness while getting up from bed.   On a separate note, his hesitancy and precipitancy symptoms have improved. He has now established care with a PCP.   Current Outpatient Medications on File Prior to Visit  Medication Sig Dispense Refill  . aspirin 81 MG chewable tablet Chew 1 tablet (81 mg total) by mouth daily. 30 tablet 2  . atorvastatin (LIPITOR) 80 MG tablet Take 1 tablet (80 mg total) by mouth daily at 6 PM. 30 tablet 2  . lisinopril (ZESTRIL) 5 MG tablet Take 1 tablet (5 mg total) by mouth daily. 30 tablet 2  . metoprolol succinate (TOPROL XL) 50 MG 24 hr tablet Take 1 tablet (50 mg total) by mouth daily. Take with or immediately following a meal. 30 tablet 2  . Multiple Vitamin (MULTIVITAMIN) tablet Take 1 tablet by mouth daily.    . pantoprazole (PROTONIX) 40 MG tablet Take 1 tablet (40 mg total) by mouth daily. 30 tablet 2  . tamsulosin (FLOMAX) 0.4 MG CAPS capsule Take 1  capsule (0.4 mg total) by mouth daily after breakfast. 30 capsule 2  . ticagrelor (BRILINTA) 90 MG TABS tablet Take 1 tablet (90 mg total) by mouth 2 (two) times daily. 60 tablet 2  . levothyroxine (SYNTHROID, LEVOTHROID) 25 MCG tablet Take 1 tablet (25 mcg total) by mouth daily before breakfast. Office visit needed (Patient not taking: Reported on 07/27/2019) 30 tablet 0   No current facility-administered medications on file prior to visit.    Cardiovascular & other pertient studies:  EKG 08/09/2019: Sinus rhythm 74 bpm.  Inferior T wave inversion, consider ischemia.  Echocardiogram 07/27/2019: 1. Hypokiinesis of the basal inferior/inferolateral wall with overall  normal LV systolic function; trace AI; mild MR.  2. Left ventricular ejection fraction, by estimation, is 55 to 60%. The  left ventricle has normal function. The left ventricle demonstrates  regional wall motion abnormalities (see scoring diagram/findings for  description). Left ventricular diastolic  parameters were normal.  3. Right ventricular systolic function is normal. The right ventricular  size is normal.  4. The mitral valve is normal in structure. Mild mitral valve  regurgitation. No evidence of mitral stenosis.  5. The aortic valve is tricuspid. Aortic valve regurgitation is trivial.  Mild aortic valve sclerosis is present, with no evidence of aortic valve  stenosis.  6. The inferior vena cava is normal in size with greater than 50%  respiratory variability, suggesting right atrial pressure of 3 mmHg.   Coronary intervention 07/26/2019:  LM: Distal 20% stenosis LAD: Minimal luminal irregularities LCx: Normal RCA: Prox 100%, mid 75%, distal 50% stenosis        Aspiration thrombectomy        Successful percutaneous coronary intervention mid to ostial RCA        PTCA and overlapping stents placement.        Resolute Onyx 3.5 X 8 mm DES        Resolute Onyx 3.5 X 38 mm DES        Resolute Onyx 3.5 X 15 mm  DES        Post dilatation with 4.0X15 mm  balloon There is slight under expansion in mid RCA due to calcium. This is <10%.  0% residual stenosis, TIMI III flow.   Recent labs: 07/27/2019: Glucose 127, BUN/Cr 10/0.98. EGFR >60. Na/K 138/4.8. AST/ALT 150/49. Rest of the CMP normal H/H 14/44. MCV 91. Platelets 233 HbA1C 5.6% Chol 203, TG 249, HDL 33, LDL 120 Lipoprotein a 62 normal TSH 3.7 normal   Review of Systems  Cardiovascular: Negative for chest pain, dyspnea on exertion, leg swelling, palpitations and syncope.  Neurological: Positive for light-headedness.         Vitals:   08/09/19 1509  BP: 129/76  Pulse: 68  Resp: 18  Temp: 97.8 F (36.6 C)  SpO2: 99%     Body mass index is 23.35 kg/m. Filed Weights   08/09/19 1509  Weight: 177 lb (80.3 kg)     Objective:   Physical Exam  Constitutional: He appears well-developed and well-nourished.  Neck: No JVD present.  Cardiovascular: Normal rate, regular rhythm, normal heart sounds and intact distal pulses.  No murmur heard. Pulmonary/Chest: Effort normal and breath sounds normal. He has no wheezes. He has no rales.  Musculoskeletal:        General: No edema.  Nursing note and vitals reviewed.         Assessment & Recommendations:   64 y/o Caucasian male with CAD s/p STEMI & primary PCI (07/2019), hypertension, hyperlipidemia, BPH, neuropathy.  CAD: S/p primary PCI to prox-mid RCA (STEMI 07/2019). Continue DAPT with Aspirin/Brilinta, Lipitor, metoprolol, lisinopril.  Repeat lipid panel in 3 months.  Lightheadedness: Recommend increasing hydration.  BPH: Continue Flomax.  Continue f/u w/PCP  Neuropathy: Continue f/u w/PCP.    Nigel Mormon, MD [facility]. [place] Pager: [phone] Office: [phone]

## 2019-08-10 ENCOUNTER — Telehealth: Payer: Self-pay

## 2019-08-10 DIAGNOSIS — R0609 Other forms of dyspnea: Secondary | ICD-10-CM

## 2019-08-10 NOTE — Telephone Encounter (Signed)
Patient asking how often he should take Toprol XL. I clarified with him to take once daily.  He also needs a letter faxed stating that he can go back to work with no restrictions fax number [phone].

## 2019-08-10 NOTE — Telephone Encounter (Signed)
Letter is in Epic, informed Felicia yesterday. Felicia, please follow up. Toprol xL once a day.  T

## 2019-08-11 ENCOUNTER — Encounter: Payer: Self-pay | Admitting: Cardiology

## 2019-08-11 LAB — LIPID PANEL
Chol/HDL Ratio: 3.1 ratio (ref 0.0–5.0)
Cholesterol, Total: 94 mg/dL — ABNORMAL LOW (ref 100–199)
HDL: 30 mg/dL — ABNORMAL LOW (ref >39)
LDL Chol Calc (NIH): 47 mg/dL (ref 0–99)
Triglycerides: 82 mg/dL (ref 0–149)
VLDL Cholesterol Cal: 17 mg/dL (ref 5–40)

## 2019-08-11 NOTE — Telephone Encounter (Signed)
Informed patient that letter has been faxed and is also available in [facility]. Patient says daughter also faxed letter to his employer from [employer]. Patient verbalized understanding.

## 2019-08-12 NOTE — Telephone Encounter (Signed)
I received a call from patient regarding shortness of breath and he is wondering whether after watching YouTube video whether lisinopril is contributing to his shortness of breath.  Patient states that when he does things he notices shortness of breath but only lasts a few minutes.  On further questioning, he has never had any lip swelling, throat swelling, difficulty with breathing acutely.  He has not had any chest pain.  I reviewed all his medications, suspect probably Brilinta may be causing him some shortness of breath.  I reassured him.  He could certainly drink a cup of coffee prior to taking Brilinta to see whether his symptoms improve.  If symptoms persist, we could consider changing him to Effient.  Patient will call us if necessary if symptoms are not getting any better.  This was a 10-minute telephone encounter.    ICD-10-CM   1. Dyspnea on exertion  R06.00

## 2019-08-15 ENCOUNTER — Other Ambulatory Visit: Payer: Self-pay

## 2019-08-15 ENCOUNTER — Encounter: Payer: Self-pay | Admitting: Primary Care

## 2019-08-15 ENCOUNTER — Ambulatory Visit (INDEPENDENT_AMBULATORY_CARE_PROVIDER_SITE_OTHER): Payer: Commercial Managed Care - PPO | Admitting: Primary Care

## 2019-08-15 DIAGNOSIS — I10 Essential (primary) hypertension: Secondary | ICD-10-CM | POA: Diagnosis not present

## 2019-08-15 DIAGNOSIS — E782 Mixed hyperlipidemia: Secondary | ICD-10-CM

## 2019-08-15 DIAGNOSIS — I252 Old myocardial infarction: Secondary | ICD-10-CM

## 2019-08-15 DIAGNOSIS — I2510 Atherosclerotic heart disease of native coronary artery without angina pectoris: Secondary | ICD-10-CM

## 2019-08-15 DIAGNOSIS — E039 Hypothyroidism, unspecified: Secondary | ICD-10-CM

## 2019-08-15 DIAGNOSIS — G629 Polyneuropathy, unspecified: Secondary | ICD-10-CM | POA: Diagnosis not present

## 2019-08-15 DIAGNOSIS — N400 Enlarged prostate without lower urinary tract symptoms: Secondary | ICD-10-CM

## 2019-08-15 NOTE — Assessment & Plan Note (Signed)
Recent diagnosis with STEMI and admission in late March 2021. Overall doing much better, compliant to all medications. Following with cardiology.  Hospital notes, imaging, labs reviewed.

## 2019-08-15 NOTE — Progress Notes (Signed)
Subjective:    Patient ID: Cory Wong, male    DOB: 1955/08/23, 64 y.o.   MRN: [id number]  HPI  This visit occurred during the SARS-CoV-2 public health emergency.  Safety protocols were in place, including screening questions prior to the visit, additional usage of staff PPE, and extensive cleaning of exam room while observing appropriate contact time as indicated for disinfecting solutions.   Cory Wong is a 64 year old male who presents today to establish care and discuss the problems mentioned below. Will obtain/review records.  1) CAD/History of MI: Recent hospital admission for inferior STEMI in late March 2021. Primary PCI for occluded prox-mid RCA. Echocardiogram with preserved EF. Currently managed on atorvastatin 80 mg, metoprolol succinate XL 50 mg, Brilinta 90 mg daily, lisinopril 5 mg. Dischared home on 07/28/19.  Following with cardiology, Dr. Virgina Jock, with last visit being last week. Overall feeling much better, some exertional shortness of breath with moderate exertion.   BP Readings from Last 3 Encounters:  08/15/19 136/76  08/09/19 129/76  07/28/19 111/76   2) Hypothyroidism: Diagnosed several years ago at [facility], was placed on levothyroxine 25 mcg but stopped taking after one year due to loss of insurance. He has not taken in 2-3 years. TSH on 07/28/19 of 3.712. TSH on 10/12/17 of 14.288.   He denies hair loss, fatigue, dry skin, constipation.   3) BPH: Currently managed on tamsulosin (Flomax) 0.4 mg which was initiated during his hospital stay. Symptoms include urinary frequency, nocturia, discomfort with urination. Since on tamsulosin he's doing much better.  He's been treated for BPH in the past, was prescribed medication which helped. Had been off medications for years.   4) Peripheral Neuropathy: Diagnosed years ago. Starts to plantar feet with radiation at times up to his lower extremities. He stands all day at work, tries to purchase wide shoes, extra  insoles. Previously managed on Lyrica, has been off of for years.   His pain has persisted, improved when sitting. He's not taking anything for symptoms at this time.   Review of Systems  Eyes: Negative for visual disturbance.  Cardiovascular: Negative for chest pain.  Gastrointestinal: Negative for constipation.  Genitourinary: Negative for difficulty urinating, frequency and urgency.  Neurological: Negative for dizziness and headaches.       Past Medical History:  Diagnosis Date  . Allergy   . BPH (benign prostatic hyperplasia)   . CAD (coronary artery disease)   . Hemorrhoids   . Hyperlipidemia   . Hypertension   . Peripheral neuropathy   . Seborrhea   . STEMI (ST elevation myocardial infarction) ([place])   . Subclinical hypothyroidism      Social History   Socioeconomic History  . Marital status: Single    Spouse name: Not on file  . Number of children: Not on file  . Years of education: Not on file  . Highest education level: Not on file  Occupational History  . Not on file  Tobacco Use  . Smoking status: Former Smoker    Packs/day: 1.00    Years: 7.00    Pack years: 7.00    Types: Cigarettes    Quit date: 1977    Years since quitting: 44.3  . Smokeless tobacco: Never Used  Substance and Sexual Activity  . Alcohol use: No  . Drug use: No  . Sexual activity: Not on file  Other Topics Concern  . Not on file  Social History Narrative  . Not on file  Social Determinants of Health   Financial Resource Strain:   . Difficulty of Paying Living Expenses:   Food Insecurity:   . Worried About Programme researcher, broadcasting/film/video in the Last Year:   . Barista in the Last Year:   Transportation Needs:   . Freight forwarder (Medical):   Marland Kitchen Lack of Transportation (Non-Medical):   Physical Activity:   . Days of Exercise per Week:   . Minutes of Exercise per Session:   Stress:   . Feeling of Stress :   Social Connections:   . Frequency of Communication with Friends  and Family:   . Frequency of Social Gatherings with Friends and Family:   . Attends Religious Services:   . Active Member of Clubs or Organizations:   . Attends Banker Meetings:   Marland Kitchen Marital Status:   Intimate Partner Violence:   . Fear of Current or Ex-Partner:   . Emotionally Abused:   Marland Kitchen Physically Abused:   . Sexually Abused:     Past Surgical History:  Procedure Laterality Date  . APPENDECTOMY    . CORONARY STENT INTERVENTION N/A 07/26/2019   Procedure: CORONARY STENT INTERVENTION;  Surgeon: Elder Negus, MD;  Location: [facility];  Service: Cardiovascular;  Laterality: N/A;  . CORONARY THROMBECTOMY N/A 07/26/2019   Procedure: Coronary Thrombectomy;  Surgeon: Elder Negus, MD;  Location: [facility];  Service: Cardiovascular;  Laterality: N/A;  . CORONARY/GRAFT ACUTE MI REVASCULARIZATION N/A 07/26/2019   Procedure: Coronary/Graft Acute MI Revascularization;  Surgeon: Elder Negus, MD;  Location: [facility];  Service: Cardiovascular;  Laterality: N/A;  . LEFT HEART CATH AND CORONARY ANGIOGRAPHY N/A 07/26/2019   Procedure: LEFT HEART CATH AND CORONARY ANGIOGRAPHY;  Surgeon: Elder Negus, MD;  Location: [facility];  Service: Cardiovascular;  Laterality: N/A;    Family History  Problem Relation Age of Onset  . Heart attack Brother   . Hyperlipidemia Mother   . Diabetes Mother   . Alzheimer's disease Father     No Known Allergies  Current Outpatient Medications on File Prior to Visit  Medication Sig Dispense Refill  . aspirin 81 MG chewable tablet Chew 1 tablet (81 mg total) by mouth daily. 90 tablet 3  . atorvastatin (LIPITOR) 80 MG tablet Take 1 tablet (80 mg total) by mouth daily at 6 PM. 90 tablet 3  . lisinopril (ZESTRIL) 5 MG tablet Take 1 tablet (5 mg total) by mouth daily. 90 tablet 3  . metoprolol succinate (TOPROL XL) 50 MG 24 hr tablet Take 1 tablet (50 mg total) by mouth daily. Take with or  immediately following a meal. 90 tablet 3  . Multiple Vitamin (MULTIVITAMIN) tablet Take 1 tablet by mouth daily.    . pantoprazole (PROTONIX) 40 MG tablet Take 1 tablet (40 mg total) by mouth daily. 90 tablet 3  . tamsulosin (FLOMAX) 0.4 MG CAPS capsule Take 1 capsule (0.4 mg total) by mouth daily after breakfast. 30 capsule 2  . ticagrelor (BRILINTA) 90 MG TABS tablet Take 1 tablet (90 mg total) by mouth 2 (two) times daily. 180 tablet 3   No current facility-administered medications on file prior to visit.    BP 136/76   Pulse 76   Temp (!) 97.1 F (36.2 C) (Temporal)   Ht 5' 11.5" (1.816 m)   Wt 176 lb 8 oz (80.1 kg)   SpO2 98%   BMI 24.27 kg/m  Objective:   Physical Exam  Constitutional: He appears well-nourished.  Cardiovascular: Normal rate and regular rhythm.  Respiratory: Effort normal and breath sounds normal.  Musculoskeletal:     Cervical back: Neck supple.  Skin: Skin is warm and dry.  Psychiatric: He has a normal mood and affect.           Assessment & Plan:

## 2019-08-15 NOTE — Assessment & Plan Note (Signed)
Recent LDL at goal.  Continue atorvastatin. 

## 2019-08-15 NOTE — Assessment & Plan Note (Signed)
Chronic symptoms, recently improved with tamsulosin that was initiated during recent hospital visit.   Continue tamsulosin.

## 2019-08-15 NOTE — Patient Instructions (Signed)
Follow up with cardiology as scheduled.  Schedule a lab appointment for three months to repeat your thyroid function.   Have your pharmacy contact me when you need refills for Flomax.  It was a pleasure to meet you today! Please don't hesitate to call or message me with any questions. Welcome to [facility]!

## 2019-08-15 NOTE — Progress Notes (Signed)
Relayed information to patient. Patient voiced understanding.  

## 2019-08-15 NOTE — Assessment & Plan Note (Signed)
Prior diagnosis, has been off of levothyroxine 25 mcg for about one year. Recent TSH within normal range, will closely monitor.  Repeat TSH 3 months.

## 2019-08-15 NOTE — Assessment & Plan Note (Signed)
Chronic for years, related to standing for occupation.  Recently his symptoms have improved as he's able to sit more at work. Continue to monitor.  Consider gabapentin in the future if needed.

## 2019-08-15 NOTE — Assessment & Plan Note (Signed)
Recent hospital admission, PCI for occluded prox-mid RCA. Compliant to DAPT. Managed on beta blocker and ACE-I. Following with cardiology.

## 2019-08-15 NOTE — Assessment & Plan Note (Signed)
Borderline today, continue to monitor.  Continue current regimen.

## 2019-08-18 ENCOUNTER — Telehealth: Payer: Self-pay

## 2019-08-18 ENCOUNTER — Ambulatory Visit (INDEPENDENT_AMBULATORY_CARE_PROVIDER_SITE_OTHER): Payer: Commercial Managed Care - PPO | Admitting: Family Medicine

## 2019-08-18 ENCOUNTER — Encounter: Payer: Self-pay | Admitting: Family Medicine

## 2019-08-18 ENCOUNTER — Other Ambulatory Visit: Payer: Self-pay

## 2019-08-18 VITALS — BP 100/64 | HR 69 | Temp 98.6°F | Ht 71.5 in | Wt 175.8 lb

## 2019-08-18 DIAGNOSIS — R519 Headache, unspecified: Secondary | ICD-10-CM

## 2019-08-18 DIAGNOSIS — M549 Dorsalgia, unspecified: Secondary | ICD-10-CM

## 2019-08-18 DIAGNOSIS — H9203 Otalgia, bilateral: Secondary | ICD-10-CM | POA: Diagnosis not present

## 2019-08-18 NOTE — Progress Notes (Signed)
Chief Complaint  Patient presents with  . Headache  . Back Pain  . Ear Pain    Bilateral    History of Present Illness: HPI  64 year old male patient  Of Cory Wong with history of  CAD, HTN, hyperlipidemia present with new onset headache and ear pain.   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 first noted sinus pressure in forehead. No head injury No decreased hearing. Wears ear plugs at work.  No congestion,  occ sneeze, no itchy eyes. Also noted pain in middle upper back in last few days since going back to work. Leaning back in chair felt better. BP Readings from Last 3 Encounters:  08/18/19 100/64  08/15/19 136/76  08/09/19 129/76   He started tylenol as needed for pain... helped some.  headache improves when at home and seems to return at work.  When got to work yesterday pain in head returned.  no current chest pain, no SOB (did have some after discharge initially, none now), no fever. He denies anxiety.  He had an appt to establish Cory Wong as PCP on 08/15/2019. Recent hospital admission 07/26/2019, PCI for occluded prox-mid RCA. Compliant to DAPT. Managed on beta blocker and ACE-I. Following with cardiology.  On brilinta for anticoagulation.  ALL pain is now resolved in office!   This visit occurred during the SARS-CoV-2 public health emergency.  Safety protocols were in place, including screening questions prior to the visit, additional usage of staff PPE, and extensive cleaning of exam room while observing appropriate contact time as indicated for disinfecting solutions.   COVID 19 screen:  No recent travel or known exposure to COVID19 The patient denies respiratory symptoms of COVID 19 at this time. The importance of social distancing was discussed today.     Review of Systems  Constitutional: Negative for chills and fever.  HENT: Positive for ear pain. Negative for congestion.   Eyes: Negative for pain and redness.   Respiratory: Negative for cough and shortness of breath.   Cardiovascular: Negative for chest pain, palpitations and leg swelling.  Gastrointestinal: Negative for abdominal pain, blood in stool, constipation, diarrhea, nausea and vomiting.  Genitourinary: Negative for dysuria.  Musculoskeletal: Positive for back pain and neck pain. Negative for falls and myalgias.  Skin: Negative for rash.  Neurological: Negative for dizziness.  Psychiatric/Behavioral: Negative for depression. The patient is not nervous/anxious.       Past Medical History:  Diagnosis Date  . Acute myocardial infarction ([facility]) 07/26/2019  . Allergy   . BPH (benign prostatic hyperplasia)   . CAD (coronary artery disease)   . Hemorrhoids   . Hyperlipidemia   . Hypertension   . Peripheral neuropathy   . Seborrhea   . STEMI (ST elevation myocardial infarction) (H[facility])   . Subclinical hypothyroidism     reports that he quit smoking about 44 years ago. His smoking use included cigarettes. He has a 7.00 pack-year smoking history. He has never used smokeless tobacco. He reports that he does not drink alcohol or use drugs.   Current Outpatient Medications:  .  aspirin 81 MG chewable tablet, Chew 1 tablet (81 mg total) by mouth daily., Disp: 90 tablet, Rfl: 3 .  atorvastatin (LIPITOR) 80 MG tablet, Take 1 tablet (80 mg total) by mouth daily at 6 PM., Disp: 90 tablet, Rfl: 3 .  lisinopril (ZESTRIL) 5 MG tablet, Take 1 tablet (5 mg total) by mouth daily., Disp: 90 tablet, Rfl:  3 .  metoprolol succinate (TOPROL XL) 50 MG 24 hr tablet, Take 1 tablet (50 mg total) by mouth daily. Take with or immediately following a meal., Disp: 90 tablet, Rfl: 3 .  Multiple Vitamin (MULTIVITAMIN) tablet, Take 1 tablet by mouth daily., Disp: , Rfl:  .  pantoprazole (PROTONIX) 40 MG tablet, Take 1 tablet (40 mg total) by mouth daily., Disp: 90 tablet, Rfl: 3 .  tamsulosin (FLOMAX) 0.4 MG CAPS capsule, Take 1 capsule (0.4 mg total) by mouth daily after  breakfast., Disp: 30 capsule, Rfl: 2 .  ticagrelor (BRILINTA) 90 MG TABS tablet, Take 1 tablet (90 mg total) by mouth 2 (two) times daily., Disp: 180 tablet, Rfl: 3   Observations/Objective: Blood pressure 100/64, pulse 69, temperature 98.6 F (37 C), temperature source Temporal, height 5' 11.5" (1.816 m), weight 175 lb 12 oz (79.7 kg), SpO2 96 %.  Physical Exam Constitutional:      Appearance: He is well-developed.  HENT:     Head: Normocephalic.     Right Ear: Hearing, ear canal and external ear normal. A middle ear effusion is present. There is no impacted cerumen.     Left Ear: Hearing, ear canal and external ear normal. A middle ear effusion is present. There is no impacted cerumen.     Nose: Nose normal.  Neck:     Thyroid: No thyroid mass or thyromegaly.     Vascular: No carotid bruit.     Trachea: Trachea normal.  Cardiovascular:     Rate and Rhythm: Normal rate and regular rhythm.     Pulses: Normal pulses.     Heart sounds: Heart sounds not distant. No murmur. No friction rub. No gallop.      Comments: No peripheral edema Pulmonary:     Effort: Pulmonary effort is normal. No respiratory distress.     Breath sounds: Normal breath sounds.  Skin:    General: Skin is warm and dry.     Findings: No rash.  Psychiatric:        Speech: Speech normal.        Behavior: Behavior normal.        Thought Content: Thought content normal.      Assessment and Plan    Upper back pain No clear cardiopulmonary cause. Most likely MSK strain for work.   NSAIDs contraindicated given CAD. Heat and gentle stretching for upper back .  Tylenol extra strength three times a day for pain.    Ear pain, bilateral Most likely due to ETD.Marland Kitchen treat with flonase.  Acute nonintractable headache Now improved... most likely due to allergies vs tension headache.    Eliezer Lofts, MD

## 2019-08-18 NOTE — Telephone Encounter (Signed)
Noted  

## 2019-08-18 NOTE — Telephone Encounter (Signed)
Patient called this morning with concerns of severe headache, bilateral ear pain and small catch sensation in the mid back. Patient was seen by Jae Dire on 08/15/19 for a new patient appointment and did not have any issues then. Patient states he noticed this week that he was having slight headache in the mornings that Tylenol would help. This morning though he has severe headache-took Advil 2 tablets around 8:30 am- and pain has not subsided but it is not any worse. Has bilateral ear pain today and the catch sensation in his mid back that he thinks is from working. B/P today 117/70 and temp 98.2. No other symptoms per patient. Spoke with Jae Dire and she advised for patient to be seen but she did not have any openings. Spoke with Dr Ermalene Searing and she was ok with patient to come and see her today.  FYI to [facility] and Dr Ermalene Searing.

## 2019-08-18 NOTE — Patient Instructions (Addendum)
Make sure drinking enough water.  Start Flonase 2 spray per nostril.  Heat and gentle stretching for upper back .  Tylenol extra strength three times a day for pain.

## 2019-08-21 ENCOUNTER — Telehealth: Payer: Self-pay | Admitting: *Deleted

## 2019-08-21 NOTE — Telephone Encounter (Signed)
Patient called stating that he saw a doctor Friday for ear pain. Patient stated that he was advised to use Flonase and tylenol. Patient stated after starting the Flonase he was not able to urinate so he stopped the Flonase. Patient stated that he is using Flomax and it is working. Patient stated that he is urinating fine now. Patient stated that he recently started Flomax after being in the hospital and he takes it daily.  Patient stated that he thinks that he has had this problem in the past, not being able to urinate with some medications.  Patient wants to know what else he should do about his ear.  Pharmacy [facility]

## 2019-08-22 ENCOUNTER — Ambulatory Visit (INDEPENDENT_AMBULATORY_CARE_PROVIDER_SITE_OTHER): Payer: Commercial Managed Care - PPO | Admitting: Family Medicine

## 2019-08-22 ENCOUNTER — Encounter: Payer: Self-pay | Admitting: Family Medicine

## 2019-08-22 ENCOUNTER — Other Ambulatory Visit: Payer: Self-pay

## 2019-08-22 VITALS — BP 104/70 | HR 73 | Temp 99.3°F | Ht 71.5 in | Wt 175.8 lb

## 2019-08-22 DIAGNOSIS — M549 Dorsalgia, unspecified: Secondary | ICD-10-CM | POA: Diagnosis not present

## 2019-08-22 MED ORDER — TAMSULOSIN HCL 0.4 MG PO CAPS: 0.4000 mg | ORAL_CAPSULE | Freq: Every day | ORAL | 2 refills | Status: DC

## 2019-08-22 MED ORDER — CYCLOBENZAPRINE HCL 10 MG PO TABS: 5.0000 mg | ORAL_TABLET | Freq: Every evening | ORAL | 0 refills | Status: DC | PRN

## 2019-08-22 NOTE — Telephone Encounter (Signed)
I am doubtful the flonase caused this SE. Is he currently taking an oral antihistamine.. like zyrtec, Xyzal ora llegra? If not start..  he could try nasocort or if not improving we can try a trial of prednsione but ( this is similar to nasal steroid except oral)

## 2019-08-22 NOTE — Progress Notes (Signed)
Chief Complaint  Patient presents with  . Back Pain    Mid Upper Back-Feels like a stabbing feeling    History of Present Illness: HPI   64 year old male pt of [facility] Donning with recent STEMI in 07/2019 presents with sharp upper back pain. Primary PCI for occluded prox-mid RCA. Echocardiogram with preserved EF. Currently managed on atorvastatin 80 mg, metoprolol succinate XL 50 mg, Brilinta 90 mg daily, lisinopril 5 mg. Dischared home on 07/28/19.  Seen on 08/18/2019 by myself for upper back pain after returning to work as well as pressure in bilalteral ears. He reports he did not tolerate Flonase given it caused urinary issues x 2 days? Had to psuh utrine out. He states he could not urinate for a short time.. this resolved after he took his flomax. No dysuria, no frequency. No urgency.   He stayed inside and now ear symptoms are resolved.  BP Readings from Last 3 Encounters:  08/22/19 104/70  08/18/19 100/64  08/15/19 136/76    When at work... pain in upper back .. returned more severe, no longer nagging pain..... 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 No back pain with exertiong self walking.   Back pain resolved if he is leaning back againts chair.  No associated CP  Or SOB. No fall  This visit occurred during the SARS-CoV-2 public health emergency.  Safety protocols were in place, including screening questions prior to the visit, additional usage of staff PPE, and extensive cleaning of exam room while observing appropriate contact time as indicated for disinfecting solutions.   COVID 19 screen:  No recent travel or known exposure to COVID19 The patient denies respiratory symptoms of COVID 19 at this time. The importance of social distancing was discussed today.     Review of Systems  Constitutional: Negative for chills and fever.  HENT: Negative for congestion and ear pain.   Eyes: Negative for pain and redness.  Respiratory: Negative for cough and  shortness of breath.   Cardiovascular: Negative for chest pain, palpitations and leg swelling.  Gastrointestinal: Negative for abdominal pain, blood in stool, constipation, diarrhea, nausea and vomiting.  Genitourinary: Negative for dysuria.  Musculoskeletal: Positive for back pain. Negative for falls and myalgias.  Skin: Negative for rash.  Neurological: Negative for dizziness.  Psychiatric/Behavioral: Negative for depression. The patient is not nervous/anxious.       Past Medical History:  Diagnosis Date  . Acute myocardial infarction ([place]) 07/26/2019  . Allergy   . BPH (benign prostatic hyperplasia)   . CAD (coronary artery disease)   . Hemorrhoids   . Hyperlipidemia   . Hypertension   . Peripheral neuropathy   . Seborrhea   . STEMI (ST elevation myocardial infarction) ([place])   . Subclinical hypothyroidism     reports that he quit smoking about 44 years ago. His smoking use included cigarettes. He has a 7.00 pack-year smoking history. He has never used smokeless tobacco. He reports that he does not drink alcohol or use drugs.   Current Outpatient Medications:  .  aspirin 81 MG chewable tablet, Chew 1 tablet (81 mg total) by mouth daily., Disp: 90 tablet, Rfl: 3 .  atorvastatin (LIPITOR) 80 MG tablet, Take 1 tablet (80 mg total) by mouth daily at 6 PM., Disp: 90 tablet, Rfl: 3 .  lisinopril (ZESTRIL) 5 MG tablet, Take 1 tablet (5 mg total) by mouth daily., Disp: 90 tablet, Rfl: 3 .  metoprolol succinate (TOPROL XL) 50 MG  24 hr tablet, Take 1 tablet (50 mg total) by mouth daily. Take with or immediately following a meal., Disp: 90 tablet, Rfl: 3 .  Multiple Vitamin (MULTIVITAMIN) tablet, Take 1 tablet by mouth daily., Disp: , Rfl:  .  pantoprazole (PROTONIX) 40 MG tablet, Take 1 tablet (40 mg total) by mouth daily., Disp: 90 tablet, Rfl: 3 .  tamsulosin (FLOMAX) 0.4 MG CAPS capsule, Take 1 capsule (0.4 mg total) by mouth daily after breakfast., Disp: 30 capsule, Rfl: 2 .  ticagrelor  (BRILINTA) 90 MG TABS tablet, Take 1 tablet (90 mg total) by mouth 2 (two) times daily., Disp: 180 tablet, Rfl: 3   Observations/Objective: Blood pressure 104/70, pulse 73, temperature 99.3 F (37.4 C), temperature source Temporal, height 5' 11.5" (1.816 m), weight 175 lb 12 oz (79.7 kg), SpO2 97 %.  Physical Exam Constitutional:      Appearance: He is well-developed.  HENT:     Head: Normocephalic.     Right Ear: Hearing normal.     Left Ear: Hearing normal.     Nose: Nose normal.  Neck:     Thyroid: No thyroid mass or thyromegaly.     Vascular: No carotid bruit.     Trachea: Trachea normal.  Cardiovascular:     Rate and Rhythm: Normal rate and regular rhythm.     Pulses: Normal pulses.     Heart sounds: Heart sounds not distant. No murmur. No friction rub. No gallop.      Comments: No peripheral edema Pulmonary:     Effort: Pulmonary effort is normal. No respiratory distress.     Breath sounds: Normal breath sounds.  Musculoskeletal:     Cervical back: Tenderness present. No bony tenderness.       Back:     Comments:  Circled area of ttp medial to scapula over paraspinous muscle  Skin:    General: Skin is warm and dry.     Findings: No rash.  Psychiatric:        Speech: Speech normal.        Behavior: Behavior normal.        Thought Content: Thought content normal.      Assessment and Plan      Kerby Nora, MD

## 2019-08-22 NOTE — Assessment & Plan Note (Signed)
No clear cardio or pulmonary issue. Resolved when not at work , not leaning over and with pressure against chair.  Likely MSK strain, muscle spasm.  Start muscle relaxant, heat and start gentle stretching.. info given.

## 2019-08-22 NOTE — Patient Instructions (Addendum)
Start home physical therapy for upper back.  Can use muscle relaxant at night as needed.  Can use topical OTC med ( icy holt or topical diclofenac gel) for pain in upper back. Call if not improving in 2 weeks.

## 2019-08-22 NOTE — Telephone Encounter (Signed)
Cory Wong notified as instructed by telephone.  He states he is now having a stabbing pain in his back. He states he has never had a pain like this before.  Appointment scheduled today at 3:00 pm with Dr. Ermalene Searing.

## 2019-08-23 NOTE — Telephone Encounter (Signed)
Patient seen by Dr. Ermalene Searing.

## 2019-08-30 DIAGNOSIS — H9203 Otalgia, bilateral: Secondary | ICD-10-CM | POA: Insufficient documentation

## 2019-08-30 DIAGNOSIS — R519 Headache, unspecified: Secondary | ICD-10-CM | POA: Insufficient documentation

## 2019-08-30 NOTE — Assessment & Plan Note (Signed)
No clear cardiopulmonary cause. Most likely MSK strain for work.   NSAIDs contraindicated given CAD. Heat and gentle stretching for upper back .  Tylenol extra strength three times a day for pain.

## 2019-08-30 NOTE — Assessment & Plan Note (Signed)
Most likely due to ETD.Marland Kitchen treat with flonase.

## 2019-08-30 NOTE — Assessment & Plan Note (Signed)
Now improved... most likely due to allergies vs tension headache.

## 2019-11-13 ENCOUNTER — Encounter: Payer: Self-pay | Admitting: Cardiology

## 2019-11-13 ENCOUNTER — Ambulatory Visit: Payer: Commercial Managed Care - PPO | Admitting: Cardiology

## 2019-11-13 ENCOUNTER — Other Ambulatory Visit: Payer: Self-pay

## 2019-11-13 VITALS — BP 111/77 | HR 67 | Resp 17 | Ht 71.0 in | Wt 170.6 lb

## 2019-11-13 DIAGNOSIS — R42 Dizziness and giddiness: Secondary | ICD-10-CM | POA: Insufficient documentation

## 2019-11-13 DIAGNOSIS — I252 Old myocardial infarction: Secondary | ICD-10-CM

## 2019-11-13 DIAGNOSIS — E782 Mixed hyperlipidemia: Secondary | ICD-10-CM

## 2019-11-13 DIAGNOSIS — I2510 Atherosclerotic heart disease of native coronary artery without angina pectoris: Secondary | ICD-10-CM

## 2019-11-13 MED ORDER — LISINOPRIL 2.5 MG PO TABS: 5.0000 mg | ORAL_TABLET | Freq: Every day | ORAL | 3 refills | Status: DC

## 2019-11-13 MED ORDER — METOPROLOL SUCCINATE ER 25 MG PO TB24: 25.0000 mg | ORAL_TABLET | Freq: Every day | ORAL | 3 refills | Status: DC

## 2019-11-13 NOTE — Progress Notes (Signed)
Follow up visit  Subjective:   Cory Wong, male    DOB: [birth year], 64 y.o.   MRN: [id number]   HPI   Chief Complaint  Patient presents with   Coronary Artery Disease   Follow-up    3 month   Results    lipid   64 y/o Caucasian male with CAD s/p STEMI & primary PCI (07/2019), hypertension, hyperlipidemia, BPH, neuropathy.  Patient is here for 22-monthfollow-up visit.  Reviewed lipid panel from April 2021, that showed significant improvement.  Patient has not had any chest pain symptoms.  He bikes outdoors regularly.  He has noticed episodes of lightheadedness, particularly with postural change.  Current Outpatient Medications on File Prior to Visit  Medication Sig Dispense Refill   aspirin 81 MG chewable tablet Chew 1 tablet (81 mg total) by mouth daily. 90 tablet 3   atorvastatin (LIPITOR) 80 MG tablet Take 1 tablet (80 mg total) by mouth daily at 6 PM. 90 tablet 3   lisinopril (ZESTRIL) 5 MG tablet Take 1 tablet (5 mg total) by mouth daily. 90 tablet 3   metoprolol succinate (TOPROL XL) 50 MG 24 hr tablet Take 1 tablet (50 mg total) by mouth daily. Take with or immediately following a meal. 90 tablet 3   Multiple Vitamin (MULTIVITAMIN) tablet Take 1 tablet by mouth daily.     pantoprazole (PROTONIX) 40 MG tablet Take 1 tablet (40 mg total) by mouth daily. 90 tablet 3   tamsulosin (FLOMAX) 0.4 MG CAPS capsule Take 1 capsule (0.4 mg total) by mouth daily after breakfast. 30 capsule 2   ticagrelor (BRILINTA) 90 MG TABS tablet Take 1 tablet (90 mg total) by mouth 2 (two) times daily. 180 tablet 3   No current facility-administered medications on file prior to visit.    Cardiovascular & other pertient studies:  EKG 08/09/2019: Sinus rhythm 74 bpm.  Inferior T wave inversion, consider ischemia.  Echocardiogram 07/27/2019: 1. Hypokiinesis of the basal inferior/inferolateral wall with overall  normal LV systolic function; trace AI; mild MR.  2. Left  ventricular ejection fraction, by estimation, is 55 to 60%. The  left ventricle has normal function. The left ventricle demonstrates  regional wall motion abnormalities (see scoring diagram/findings for  description). Left ventricular diastolic  parameters were normal.  3. Right ventricular systolic function is normal. The right ventricular  size is normal.  4. The mitral valve is normal in structure. Mild mitral valve  regurgitation. No evidence of mitral stenosis.  5. The aortic valve is tricuspid. Aortic valve regurgitation is trivial.  Mild aortic valve sclerosis is present, with no evidence of aortic valve  stenosis.  6. The inferior vena cava is normal in size with greater than 50%  respiratory variability, suggesting right atrial pressure of 3 mmHg.   Coronary intervention 07/26/2019: LM: Distal 20% stenosis LAD: Minimal luminal irregularities LCx: Normal RCA: Prox 100%, mid 75%, distal 50% stenosis        Aspiration thrombectomy        Successful percutaneous coronary intervention mid to ostial RCA        PTCA and overlapping stents placement.        Resolute Onyx 3.5 X 8 mm DES        Resolute Onyx 3.5 X 38 mm DES        Resolute Onyx 3.5 X 15 mm DES        Post dilatation with 4.0X15 mm [place] balloon There is slight under expansion in  mid RCA due to calcium. This is <10%.  0% residual stenosis, TIMI III flow.   Recent labs: 08/10/2019: Chol 94, TG 82, HDL 30, LDL 47  07/27/2019: Glucose 127, BUN/Cr 10/0.98. EGFR >60. Na/K 138/4.8. AST/ALT 150/49. Rest of the CMP normal H/H 14/44. MCV 91. Platelets 233 HbA1C 5.6% Chol 203, TG 249, HDL 33, LDL 120 Lipoprotein a 62 normal TSH 3.7 normal   Review of Systems  Cardiovascular: Negative for chest pain, dyspnea on exertion, leg swelling, palpitations and syncope.  Neurological: Positive for light-headedness.         Vitals:   11/13/19 1539  BP: 111/77  Pulse: 67  Resp: 17  SpO2: 97%     Body mass index is  23.79 kg/m. Filed Weights   11/13/19 1539  Weight: 170 lb 9.6 oz (77.4 kg)     Objective:   Physical Exam Vitals and nursing note reviewed.  Constitutional:      Appearance: He is well-developed.  Neck:     Vascular: No JVD.  Cardiovascular:     Rate and Rhythm: Normal rate and regular rhythm.     Pulses: Intact distal pulses.     Heart sounds: Normal heart sounds. No murmur heard.   Pulmonary:     Effort: Pulmonary effort is normal.     Breath sounds: Normal breath sounds. No wheezing or rales.           Assessment & Recommendations:   64 y/o Caucasian male with CAD s/p STEMI & primary PCI (07/2019), hypertension, hyperlipidemia, BPH, neuropathy.  Lightheadedness: Probably medication induced.  Reduce metoprolol succinate to 25 mg daily, lisinopril to 2.5 mg daily. Encourage liberal hydration.  CAD: S/p primary PCI to prox-mid RCA (STEMI 07/2019). Continue DAPT with Aspirin/Brilinta till 07/2020. Continue Lipitor, metoprolol, lisinopril.  Lipids very well controlled  BPH: Continue Flomax.  Continue f/u w/PCP  Neuropathy: Continue f/u w/PCP.  Follow-up in 2 months  Benuel Ly Esther Hardy, MD [facility] [place] Pager: [phone] Office: [phone]

## 2019-11-20 ENCOUNTER — Other Ambulatory Visit: Payer: Self-pay | Admitting: Family Medicine

## 2019-11-20 DIAGNOSIS — N400 Enlarged prostate without lower urinary tract symptoms: Secondary | ICD-10-CM

## 2019-11-20 NOTE — Telephone Encounter (Signed)
Last prescribed on 08/22/2019 by Dr Ermalene Searing Last OV (establish care) with [name] on 08/15/2019 No future OV scheduled

## 2019-11-20 NOTE — Telephone Encounter (Signed)
Refills sent to pharmacy. 

## 2020-01-15 ENCOUNTER — Encounter: Payer: Self-pay | Admitting: Cardiology

## 2020-01-15 ENCOUNTER — Ambulatory Visit: Payer: Commercial Managed Care - PPO | Admitting: Cardiology

## 2020-01-15 ENCOUNTER — Other Ambulatory Visit: Payer: Self-pay

## 2020-01-15 VITALS — BP 138/85 | HR 52 | Resp 16 | Ht 71.0 in | Wt 170.8 lb

## 2020-01-15 DIAGNOSIS — E782 Mixed hyperlipidemia: Secondary | ICD-10-CM

## 2020-01-15 DIAGNOSIS — R42 Dizziness and giddiness: Secondary | ICD-10-CM

## 2020-01-15 DIAGNOSIS — I2510 Atherosclerotic heart disease of native coronary artery without angina pectoris: Secondary | ICD-10-CM

## 2020-01-15 NOTE — Progress Notes (Signed)
Follow up visit  Subjective:   Cory Wong, male    DOB: 02/05/1956, 64 y.o.   MRN: [id number]   HPI   Chief Complaint  Patient presents with   Coronary Artery Disease   Dizziness   Follow-up    2 month   64 y/o Caucasian male with CAD s/p STEMI & primary PCI (07/2019), hypertension, hyperlipidemia, BPH, neuropathy.  Lightheadedness has improved after reducing dose of metoprolol and lisinopril. He denies any exertional chest pain or dyspnea. He developed constant pain and bruise in left arm after receiving COVID vaccine in his right arm. He asks me if left arm pain is related to the vaccine.  Current Outpatient Medications on File Prior to Visit  Medication Sig Dispense Refill   aspirin 81 MG chewable tablet Chew 1 tablet (81 mg total) by mouth daily. 90 tablet 3   atorvastatin (LIPITOR) 80 MG tablet Take 1 tablet (80 mg total) by mouth daily at 6 PM. 90 tablet 3   lisinopril (ZESTRIL) 2.5 MG tablet Take 2 tablets (5 mg total) by mouth daily. 90 tablet 3   metoprolol succinate (TOPROL XL) 25 MG 24 hr tablet Take 1 tablet (25 mg total) by mouth daily. Take with or immediately following a meal. 90 tablet 3   Multiple Vitamin (MULTIVITAMIN) tablet Take 1 tablet by mouth daily.     pantoprazole (PROTONIX) 40 MG tablet Take 1 tablet (40 mg total) by mouth daily. 90 tablet 3   tamsulosin (FLOMAX) 0.4 MG CAPS capsule Take 1 capsule (0.4 mg total) by mouth daily after breakfast. 90 capsule 0   ticagrelor (BRILINTA) 90 MG TABS tablet Take 1 tablet (90 mg total) by mouth 2 (two) times daily. 180 tablet 3   No current facility-administered medications on file prior to visit.    Cardiovascular & other pertient studies:  EKG 08/09/2019: Sinus rhythm 74 bpm.  Inferior T wave inversion, consider ischemia.  Echocardiogram 07/27/2019: 1. Hypokiinesis of the basal inferior/inferolateral wall with overall  normal LV systolic function; trace AI; mild MR.  2. Left ventricular  ejection fraction, by estimation, is 55 to 60%. The  left ventricle has normal function. The left ventricle demonstrates  regional wall motion abnormalities (see scoring diagram/findings for  description). Left ventricular diastolic  parameters were normal.  3. Right ventricular systolic function is normal. The right ventricular  size is normal.  4. The mitral valve is normal in structure. Mild mitral valve  regurgitation. No evidence of mitral stenosis.  5. The aortic valve is tricuspid. Aortic valve regurgitation is trivial.  Mild aortic valve sclerosis is present, with no evidence of aortic valve  stenosis.  6. The inferior vena cava is normal in size with greater than 50%  respiratory variability, suggesting right atrial pressure of 3 mmHg.   Coronary intervention 07/26/2019: LM: Distal 20% stenosis LAD: Minimal luminal irregularities LCx: Normal RCA: Prox 100%, mid 75%, distal 50% stenosis        Aspiration thrombectomy        Successful percutaneous coronary intervention mid to ostial RCA        PTCA and overlapping stents placement.        Resolute Onyx 3.5 X 8 mm DES        Resolute Onyx 3.5 X 38 mm DES        Resolute Onyx 3.5 X 15 mm DES        Post dilatation with 4.0X15 mm [place] balloon There is slight under expansion in  mid RCA due to calcium. This is <10%.  0% residual stenosis, TIMI III flow.   Recent labs: 08/10/2019: Chol 94, TG 82, HDL 30, LDL 47  07/27/2019: Glucose 127, BUN/Cr 10/0.98. EGFR >60. Na/K 138/4.8. AST/ALT 150/49. Rest of the CMP normal H/H 14/44. MCV 91. Platelets 233 HbA1C 5.6% Chol 203, TG 249, HDL 33, LDL 120 Lipoprotein a 62 normal TSH 3.7 normal   Review of Systems  Cardiovascular: Negative for chest pain, dyspnea on exertion, leg swelling, palpitations and syncope.  Neurological: Positive for light-headedness.         Vitals:   01/15/20 1538  BP: 138/85  Pulse: (!) 52  Resp: 16  SpO2: 95%     Body mass index is 23.82  kg/m. Filed Weights   01/15/20 1538  Weight: 170 lb 12.8 oz (77.5 kg)     Objective:   Physical Exam Vitals and nursing note reviewed.  Constitutional:      Appearance: He is well-developed.  Neck:     Vascular: No JVD.  Cardiovascular:     Rate and Rhythm: Normal rate and regular rhythm.     Pulses: Intact distal pulses.     Heart sounds: Normal heart sounds. No murmur heard.   Pulmonary:     Effort: Pulmonary effort is normal.     Breath sounds: Normal breath sounds. No wheezing or rales.  Skin:    Comments: Bruise on left arm           Assessment & Recommendations:   64 y/o Caucasian male with CAD s/p STEMI & primary PCI (07/2019), hypertension, hyperlipidemia, BPH, neuropathy.  Lightheadedness: Probably medication induced.  Improved after reducing metoprolol succinate and lisinopril.  CAD: S/p primary PCI to prox-mid RCA (STEMI 07/2019). Continue DAPT with Aspirin/Brilinta till 07/2020. Continue Lipitor, metoprolol, lisinopril.  Lipids very well controlled Recent left arm pain likely musculoskeletal due to inadvertent injury. Unrelated to CAD or vaccination.   BPH: Continue Flomax.  Continue f/u w/PCP  Neuropathy: Continue f/u w/PCP.  Lipid panel and f/u n 6 months  Cory Wong Cory Hardy, MD [facility] Pager: [phone] Office: [phone]

## 2020-02-13 ENCOUNTER — Other Ambulatory Visit: Payer: Self-pay | Admitting: Primary Care

## 2020-02-13 DIAGNOSIS — N400 Enlarged prostate without lower urinary tract symptoms: Secondary | ICD-10-CM

## 2020-05-09 ENCOUNTER — Other Ambulatory Visit: Payer: Self-pay | Admitting: Primary Care

## 2020-05-09 DIAGNOSIS — N400 Enlarged prostate without lower urinary tract symptoms: Secondary | ICD-10-CM

## 2020-05-25 ENCOUNTER — Other Ambulatory Visit: Payer: Self-pay

## 2020-05-25 ENCOUNTER — Telehealth: Payer: Commercial Managed Care - PPO | Admitting: Internal Medicine

## 2020-05-27 ENCOUNTER — Telehealth: Payer: Self-pay

## 2020-05-27 NOTE — Telephone Encounter (Signed)
Called and spoke with patient who stated that he developed "red/itchy dots" on his back, shoulders and arms after testing positive for covid. Patient stated that he has been taking Benadryl and applying Hydrocortisone Cream, which is helping. Patient denies pain to the area and states that they are going away. Patient states that he has a virtual visit today at 45 with [facility].

## 2020-05-27 NOTE — Telephone Encounter (Signed)
[facility] [place] [facility] - Client TELEPHONE ADVICE RECORD [facility] Patient Name: Cory Wong Gender: Male DOB: March 04, 1956 Age: 65 Y 11 M 8 D Return Phone Number: [phone] (Primary) Address: [place] [address] Client [facility] Day - Client Client Site [facility] [place] - Day Physician Cory Wong - NP Contact Type Call Who Is Calling Patient / Member / Family / Caregiver Call Type Triage / Clinical Relationship To Patient Self Return Phone Number [phone] (Primary) Chief Complaint Rash - Widespread Reason for Call Symptomatic / Request for Health Information Initial Comment Caller states he is positive for covid and he is breaking out in a rash on his shoulders and upper back. Translation No Nurse Assessment Nurse: Cory Fus, RN, Cory Wong Date/Time (Eastern Time): 05/24/2020 5:06:23 PM Confirm and document reason for call. If symptomatic, describe symptoms. ---Caller states he is COVID positive and also has a rash on his shoulders and upper back that started prior to testing positive. States he has been taking Benadryl and hydrocortisone cream. States no fever, no N/V, no abdominal pain. Does the patient have any new or worsening symptoms? ---Yes Will a triage be completed? ---Yes Related visit to physician within the last 2 weeks? ---No Does the PT have any chronic conditions? (i.e. diabetes, asthma, this includes High risk factors for pregnancy, etc.) ---Yes List chronic conditions. ---MI, CAD, HTN, cholesterol, GERD, BPH Is this a behavioral health or substance abuse call? ---No Guidelines Guideline Title Affirmed Question Affirmed Notes Nurse Date/Time (Eastern Time) Rash or Redness - Widespread [1] Purple or bloodcolored rash (spots or dots) AND [2] no fever AND [3] sounds well to triager Cory Fus, RN, Cory Wong 05/24/2020 5:13:32 PM Disp. Time Cory Wong Time) Disposition Final  User 05/24/2020 5:26:01 PM See HCP within 4 Hours (or PCP triage) Yes Cory Fus, RN, Cory Wong PLEASE NOTE: All timestamps contained within this report are represented as [place] Standard Time. CONFIDENTIALTY NOTICE: This fax transmission is intended only for the addressee. It contains information that is legally privileged, confidential or otherwise protected from use or disclosure. If you are not the intended recipient, you are strictly prohibited from reviewing, disclosing, copying using or disseminating any of this information or taking any action in reliance on or regarding this information. If you have received this fax in error, please notify us immediately by telephone so that we can arrange for its return to [place]. Phone: [phone], Toll-Free: [phone], Fax: [phone] Page: 2 of 2 Call Id: [id number] Caller Disagree/Comply Comply Caller Understands Yes PreDisposition Call Doctor Care Advice Given Per Guideline * IF OFFICE WILL BE CLOSED AND NO PCP (PRIMARY CARE PROVIDER) SECOND-LEVEL TRIAGE: You need to be seen within the next 3 or 4 hours. A nearby Urgent Care Center [facility]) is often a good source of care. Another choice is to go to the ED. Go sooner if you become worse. SEE HCP (OR PCP TRIAGE) WITHIN 4 HOURS: * UCC: Some UCCs can manage patients who are stable and have less serious symptoms (e.g., minor illnesses and injuries). The triager must know the [facility] capabilities before sending a patient there. If unsure, call ahead. BRING MEDICINES: * Please bring a list of your current medicines when you go to see the doctor. CARE ADVICE given per Rash - Widespread and Cause Unknown (Adult) guideline. Referrals GO TO FACILITY UNDECIDED

## 2020-05-27 NOTE — Telephone Encounter (Signed)
Noted, glad the spots are improving.

## 2020-07-03 LAB — LIPID PANEL
Chol/HDL Ratio: 3.6 ratio (ref 0.0–5.0)
Cholesterol, Total: 131 mg/dL (ref 100–199)
HDL: 36 mg/dL — ABNORMAL LOW (ref >39)
LDL Chol Calc (NIH): 74 mg/dL (ref 0–99)
Triglycerides: 112 mg/dL (ref 0–149)
VLDL Cholesterol Cal: 21 mg/dL (ref 5–40)

## 2020-07-15 ENCOUNTER — Ambulatory Visit: Payer: Commercial Managed Care - PPO | Admitting: Cardiology

## 2020-07-24 ENCOUNTER — Other Ambulatory Visit: Payer: Self-pay

## 2020-07-24 ENCOUNTER — Encounter: Payer: Self-pay | Admitting: Cardiology

## 2020-07-24 ENCOUNTER — Ambulatory Visit: Payer: Commercial Managed Care - PPO | Admitting: Cardiology

## 2020-07-24 VITALS — BP 122/88 | HR 78 | Temp 98.8°F | Resp 16 | Ht 71.0 in | Wt 165.0 lb

## 2020-07-24 DIAGNOSIS — I2510 Atherosclerotic heart disease of native coronary artery without angina pectoris: Secondary | ICD-10-CM

## 2020-07-24 DIAGNOSIS — R748 Abnormal levels of other serum enzymes: Secondary | ICD-10-CM

## 2020-07-24 NOTE — Progress Notes (Signed)
Follow up visit  Subjective:   Cory Wong, male    DOB: [birth year], 65 y.o.   MRN: [id number]   HPI   Chief Complaint  Patient presents with  . Coronary Artery Disease  . Follow-up    6 month   65 y/o Caucasian male with CAD s/p STEMI & primary PCI (07/2019), hypertension, hyperlipidemia, BPH, neuropathy.  Patient is doing well. He rides bike for 30 min without chest pain, shortness of breath, palpitations, leg edema, orthopnea, PND, TIA/syncope. He recently developed a bruise on his left ankle after an injury.  Recent lipid panel reviewed with the patient, details below.   Current Outpatient Medications on File Prior to Visit  Medication Sig Dispense Refill  . aspirin 81 MG chewable tablet Chew 1 tablet (81 mg total) by mouth daily. 90 tablet 3  . atorvastatin (LIPITOR) 80 MG tablet Take 1 tablet (80 mg total) by mouth daily at 6 PM. 90 tablet 3  . lisinopril (ZESTRIL) 2.5 MG tablet Take 2 tablets (5 mg total) by mouth daily. (Patient taking differently: Take 2.5 mg by mouth daily. ) 90 tablet 3  . metoprolol succinate (TOPROL XL) 25 MG 24 hr tablet Take 1 tablet (25 mg total) by mouth daily. Take with or immediately following a meal. 90 tablet 3  . Multiple Vitamin (MULTIVITAMIN) tablet Take 1 tablet by mouth daily.    . pantoprazole (PROTONIX) 40 MG tablet Take 1 tablet (40 mg total) by mouth daily. 90 tablet 3  . tamsulosin (FLOMAX) 0.4 MG CAPS capsule TAKE 1 CAPSULE (0.4 MG TOTAL) BY MOUTH DAILY AFTER BREAKFAST. 30 capsule 2  . ticagrelor (BRILINTA) 90 MG TABS tablet Take 1 tablet (90 mg total) by mouth 2 (two) times daily. 180 tablet 3   No current facility-administered medications on file prior to visit.    Cardiovascular & other pertient studies:  EKG 07/24/2020: Sinus rhythm 69 bpm Normal EKG  Echocardiogram 07/27/2019: 1. Hypokiinesis of the basal inferior/inferolateral wall with overall  normal LV systolic function; trace AI; mild MR.  2. Left  ventricular ejection fraction, by estimation, is 55 to 60%. The  left ventricle has normal function. The left ventricle demonstrates  regional wall motion abnormalities (see scoring diagram/findings for  description). Left ventricular diastolic  parameters were normal.  3. Right ventricular systolic function is normal. The right ventricular  size is normal.  4. The mitral valve is normal in structure. Mild mitral valve  regurgitation. No evidence of mitral stenosis.  5. The aortic valve is tricuspid. Aortic valve regurgitation is trivial.  Mild aortic valve sclerosis is present, with no evidence of aortic valve  stenosis.  6. The inferior vena cava is normal in size with greater than 50%  respiratory variability, suggesting right atrial pressure of 3 mmHg.   Coronary intervention 07/26/2019: LM: Distal 20% stenosis LAD: Minimal luminal irregularities LCx: Normal RCA: Prox 100%, mid 75%, distal 50% stenosis        Aspiration thrombectomy        Successful percutaneous coronary intervention mid to ostial RCA        PTCA and overlapping stents placement.        Resolute Onyx 3.5 X 8 mm DES        Resolute Onyx 3.5 X 38 mm DES        Resolute Onyx 3.5 X 15 mm DES        Post dilatation with 4.0X15 mm [place] balloon There is slight under expansion in  mid RCA due to calcium. This is <10%.  0% residual stenosis, TIMI III flow.   Recent labs: 07/02/2020: Chol 131, TG 112, HDL 36, LDL 74  08/10/2019: Chol 94, TG 82, HDL 30, LDL 47  07/27/2019: Glucose 127, BUN/Cr 10/0.98. EGFR >60. Na/K 138/4.8. AST/ALT 150/49. Rest of the CMP normal H/H 14/44. MCV 91. Platelets 233 HbA1C 5.6% Chol 203, TG 249, HDL 33, LDL 120 Lipoprotein a 62 normal TSH 3.7 normal   Review of Systems  Cardiovascular: Negative for chest pain, dyspnea on exertion, leg swelling, palpitations and syncope.  Neurological: Positive for light-headedness.         Vitals:   07/24/20 1500  BP: 122/88  Pulse: 78   Resp: 16  Temp: 98.8 F (37.1 C)  SpO2: 96%     Body mass index is 23.01 kg/m. Filed Weights   07/24/20 1500  Weight: 165 lb (74.8 kg)     Objective:   Physical Exam Vitals and nursing note reviewed.  Constitutional:      Appearance: He is well-developed.  Neck:     Vascular: No JVD.  Cardiovascular:     Rate and Rhythm: Normal rate and regular rhythm.     Pulses: Normal pulses and intact distal pulses.     Heart sounds: Normal heart sounds. No murmur heard.   Pulmonary:     Effort: Pulmonary effort is normal.     Breath sounds: Normal breath sounds. No wheezing or rales.  Musculoskeletal:     Right lower leg: No edema.     Left lower leg: No edema.  Skin:    Comments: Ecchymosis on left ankle           Assessment & Recommendations:   65 y/o Caucasian male with CAD s/p STEMI & primary PCI (07/2019), hypertension, hyperlipidemia, BPH, neuropathy.  CAD: S/p primary PCI to prox-mid RCA (STEMI 07/2019). No angina symptoms. Stop Brilinta. Continue Aspirin. Continue Lipitor, metoprolol, lisinopril.  LDL 74. Repeat lipids in 3 months. If LDL remains >70, will consider adding Zetia or Repatha. Also repeat LFT's. They were elevated in 07/2019, likely due to MI. Will check again.  BPH: Continue Flomax.  Continue f/u w/PCP  Neuropathy: Continue f/u w/PCP.  F/u in 1 year  Nigel Mormon, MD [facility] Pager: [phone] Office: [phone]

## 2020-08-15 ENCOUNTER — Other Ambulatory Visit: Payer: Self-pay | Admitting: Cardiology

## 2020-08-15 DIAGNOSIS — I2510 Atherosclerotic heart disease of native coronary artery without angina pectoris: Secondary | ICD-10-CM

## 2020-08-20 ENCOUNTER — Other Ambulatory Visit: Payer: Self-pay | Admitting: Cardiology

## 2020-08-20 ENCOUNTER — Other Ambulatory Visit: Payer: Self-pay | Admitting: Primary Care

## 2020-08-20 DIAGNOSIS — I2510 Atherosclerotic heart disease of native coronary artery without angina pectoris: Secondary | ICD-10-CM

## 2020-08-20 DIAGNOSIS — N400 Enlarged prostate without lower urinary tract symptoms: Secondary | ICD-10-CM

## 2020-08-20 NOTE — Telephone Encounter (Signed)
Patient is due for CPE/follow up in early May, this will be required prior to any further refills.  Please schedule.   

## 2020-09-11 ENCOUNTER — Other Ambulatory Visit: Payer: Self-pay | Admitting: Primary Care

## 2020-09-11 DIAGNOSIS — N400 Enlarged prostate without lower urinary tract symptoms: Secondary | ICD-10-CM

## 2020-09-16 NOTE — Telephone Encounter (Signed)
Pt scheduled for follow up on 5/20

## 2020-09-20 ENCOUNTER — Other Ambulatory Visit: Payer: Self-pay

## 2020-09-20 ENCOUNTER — Encounter: Payer: Self-pay | Admitting: Primary Care

## 2020-09-20 ENCOUNTER — Ambulatory Visit (INDEPENDENT_AMBULATORY_CARE_PROVIDER_SITE_OTHER): Payer: Commercial Managed Care - PPO | Admitting: Primary Care

## 2020-09-20 VITALS — BP 118/62 | HR 81 | Temp 98.4°F | Ht 71.0 in | Wt 160.0 lb

## 2020-09-20 DIAGNOSIS — E782 Mixed hyperlipidemia: Secondary | ICD-10-CM

## 2020-09-20 DIAGNOSIS — I2510 Atherosclerotic heart disease of native coronary artery without angina pectoris: Secondary | ICD-10-CM

## 2020-09-20 DIAGNOSIS — R21 Rash and other nonspecific skin eruption: Secondary | ICD-10-CM | POA: Insufficient documentation

## 2020-09-20 DIAGNOSIS — R748 Abnormal levels of other serum enzymes: Secondary | ICD-10-CM

## 2020-09-20 DIAGNOSIS — Z125 Encounter for screening for malignant neoplasm of prostate: Secondary | ICD-10-CM | POA: Diagnosis not present

## 2020-09-20 DIAGNOSIS — N400 Enlarged prostate without lower urinary tract symptoms: Secondary | ICD-10-CM

## 2020-09-20 DIAGNOSIS — E039 Hypothyroidism, unspecified: Secondary | ICD-10-CM | POA: Diagnosis not present

## 2020-09-20 DIAGNOSIS — I10 Essential (primary) hypertension: Secondary | ICD-10-CM | POA: Diagnosis not present

## 2020-09-20 MED ORDER — PREDNISONE 20 MG PO TABS: ORAL_TABLET | ORAL | 0 refills | Status: DC

## 2020-09-20 NOTE — Assessment & Plan Note (Signed)
Labs pending.  

## 2020-09-20 NOTE — Assessment & Plan Note (Signed)
Well controlled in the office today, continue metoprolol succinate 25 mg, lisinopril 2.5 mg.   CMP pending.

## 2020-09-20 NOTE — Assessment & Plan Note (Signed)
Moderate today and represents poison ivy dermatitis.  No improvement with Benadryl, Claritin, Pepcid.   Rx for prednisone course provided. Stop all other medications. He will update.

## 2020-09-20 NOTE — Patient Instructions (Signed)
Start prednisone steroids for your rash.  Take 3 tablets by mouth once daily for 2 days, then 2 tablets for 4 days, then 1 tablet for 4 days.  Avoid Benadryl, Claritin, Pepcid.  Stop by the lab prior to leaving today. I will notify you of your results once received.   It was a pleasure to see you today!

## 2020-09-20 NOTE — Assessment & Plan Note (Signed)
Doing well on tamsulosin 0.4 mg daily, continue same.  

## 2020-09-20 NOTE — Assessment & Plan Note (Signed)
Asymptomatic, follows with cardiology. Continue atorvastatin 80 mg, metoprolol succinate 25 mg, lisinopril 2.5 mg.   Continue same.

## 2020-09-20 NOTE — Assessment & Plan Note (Signed)
No longer on levothyroxine, has been off for over several years. Repeat TSH pending.

## 2020-09-20 NOTE — Assessment & Plan Note (Signed)
Compliant to atorvastatin 80 mg, continue same.  Recent lipid panel pending.

## 2020-09-20 NOTE — Progress Notes (Signed)
Subjective:    Patient ID: Cory Wong, male    DOB: Sep 01, 1955, 65 y.o.   MRN: [id number]  HPI  Cory Wong is a very pleasant 65 y.o. male with a history of hypertension, hypothyroidism, peripheral neuropathy, hyperlipidemia, BPH, who presents today for follow up of chronic conditions and to discuss rash.  1) Rash: Located to the bilateral medial lower extremities including thighs and calves which began three weeks ago after doing some yard work.   He was outdoors cutting vines and weed eating, encountered poison ivy. He actually pulled the vines with his hands. The rash originally began to his upper extremities and neck which have improved but now spread to the lower extremities.   He's tried hydrocortisone cream and Benadryl without improvement. He went to an Urgent Care two weeks ago, was provided with an injection of steroids and told to take Claritin, Pepcid, and Benadryl. He's not noticed much improvement.   No one else in his household is itching.   2) Essential Hypertension/CAD/Hyperlipidemia: Currently managed on lisinopril 2.5 mg and metoprolol succinate 25 mg. Also on atorvastatin 80 mg. Follows with cardiology who is monitoring labs including cholesterol. Recent LDL of 74.   3) BPH: Currently managed on tamsulosin 0.4 mg daily. Overall fells well managed on this regimen.  BP Readings from Last 3 Encounters:  09/20/20 118/62  07/24/20 122/88  01/15/20 138/85        Review of Systems  Respiratory: Negative for shortness of breath.   Cardiovascular: Negative for chest pain.  Genitourinary: Negative for difficulty urinating.  Skin: Positive for rash.         Past Medical History:  Diagnosis Date  . Acute myocardial infarction (HCC) 07/26/2019  . Allergy   . BPH (benign prostatic hyperplasia)   . CAD (coronary artery disease)   . Hemorrhoids   . Hyperlipidemia   . Hypertension   . Peripheral neuropathy   . Seborrhea   . STEMI (ST elevation  myocardial infarction) (HCC)   . Subclinical hypothyroidism     Social History   Socioeconomic History  . Marital status: Single    Spouse name: Not on file  . Number of children: 2  . Years of education: Not on file  . Highest education level: Not on file  Occupational History  . Not on file  Tobacco Use  . Smoking status: Former Smoker    Packs/day: 1.00    Years: 7.00    Pack years: 7.00    Types: Cigarettes    Quit date: 1977    Years since quitting: 45.4  . Smokeless tobacco: Never Used  Vaping Use  . Vaping Use: Never used  Substance and Sexual Activity  . Alcohol use: No  . Drug use: No  . Sexual activity: Not on file  Other Topics Concern  . Not on file  Social History Narrative  . Not on file   Social Determinants of Health   Financial Resource Strain: Not on file  Food Insecurity: Not on file  Transportation Needs: Not on file  Physical Activity: Not on file  Stress: Not on file  Social Connections: Not on file  Intimate Partner Violence: Not on file    Past Surgical History:  Procedure Laterality Date  . APPENDECTOMY    . CORONARY STENT INTERVENTION N/A 07/26/2019   Procedure: CORONARY STENT INTERVENTION;  Surgeon: Elder Negus, MD;  Location: [facility];  Service: Cardiovascular;  Laterality: N/A;  . CORONARY THROMBECTOMY  N/A 07/26/2019   Procedure: Coronary Thrombectomy;  Surgeon: Elder Negus, MD;  Location: [facility];  Service: Cardiovascular;  Laterality: N/A;  . CORONARY/GRAFT ACUTE MI REVASCULARIZATION N/A 07/26/2019   Procedure: Coronary/Graft Acute MI Revascularization;  Surgeon: Elder Negus, MD;  Location: [facility];  Service: Cardiovascular;  Laterality: N/A;  . LEFT HEART CATH AND CORONARY ANGIOGRAPHY N/A 07/26/2019   Procedure: LEFT HEART CATH AND CORONARY ANGIOGRAPHY;  Surgeon: Elder Negus, MD;  Location: [facility];  Service: Cardiovascular;  Laterality: N/A;    Family  History  Problem Relation Age of Onset  . Heart attack Brother   . Hyperlipidemia Mother   . Diabetes Mother   . Alzheimer's disease Father     No Known Allergies  Current Outpatient Medications on File Prior to Visit  Medication Sig Dispense Refill  . atorvastatin (LIPITOR) 80 MG tablet TAKE 1 TABLET (80 MG TOTAL) BY MOUTH DAILY AT 6 PM. 30 tablet 11  . [facility] ASPIRIN ADULT LOW DOSE 81 MG chewable tablet CHEW 1 TABLET (81 MG TOTAL) BY MOUTH DAILY. 30 tablet 14  . lisinopril (ZESTRIL) 2.5 MG tablet Take 2 tablets (5 mg total) by mouth daily. (Patient taking differently: Take 2.5 mg by mouth daily.) 90 tablet 3  . metoprolol succinate (TOPROL XL) 25 MG 24 hr tablet Take 1 tablet (25 mg total) by mouth daily. Take with or immediately following a meal. 90 tablet 3  . Multiple Vitamin (MULTIVITAMIN) tablet Take 1 tablet by mouth daily.    . pantoprazole (PROTONIX) 40 MG tablet TAKE 1 TABLET BY MOUTH EVERY DAY 30 tablet 11  . tamsulosin (FLOMAX) 0.4 MG CAPS capsule TAKE 1 CAPSULE BY MOUTH EVERY DAY AFTER BREAKFAST 30 capsule 0   No current facility-administered medications on file prior to visit.    BP 118/62   Pulse 81   Temp 98.4 F (36.9 C) (Temporal)   Ht 5\' 11"  (1.803 m)   Wt 160 lb (72.6 kg)   SpO2 96%   BMI 22.32 kg/m  Objective:   Physical Exam Cardiovascular:     Rate and Rhythm: Normal rate and regular rhythm.  Pulmonary:     Effort: Pulmonary effort is normal.     Breath sounds: Normal breath sounds. No wheezing or rales.  Musculoskeletal:     Cervical back: Neck supple.  Skin:    General: Skin is warm and dry.     Comments: Erythematous rash to bilateral medial thighs and calves representative of poison ivy dermatitis.   Neurological:     Mental Status: He is alert and oriented to person, place, and time.           Assessment & Plan:      This visit occurred during the SARS-CoV-2 public health emergency.  Safety protocols were in place, including  screening questions prior to the visit, additional usage of staff PPE, and extensive cleaning of exam room while observing appropriate contact time as indicated for disinfecting solutions.

## 2020-09-21 LAB — HEPATIC FUNCTION PANEL
AG Ratio: 2 (calc) (ref 1.0–2.5)
ALT: 22 U/L (ref 9–46)
AST: 21 U/L (ref 10–35)
Albumin: 4.5 g/dL (ref 3.6–5.1)
Alkaline phosphatase (APISO): 57 U/L (ref 35–144)
Bilirubin, Direct: 0.1 mg/dL (ref 0.0–0.2)
Globulin: 2.2 g/dL (calc) (ref 1.9–3.7)
Indirect Bilirubin: 0.5 mg/dL (calc) (ref 0.2–1.2)
Total Bilirubin: 0.6 mg/dL (ref 0.2–1.2)
Total Protein: 6.7 g/dL (ref 6.1–8.1)

## 2020-09-21 LAB — HEMOGLOBIN A1C
Hgb A1c MFr Bld: 5.3 % of total Hgb (ref <5.7)
Mean Plasma Glucose: 105 mg/dL
eAG (mmol/L): 5.8 mmol/L

## 2020-09-21 LAB — TSH: TSH: 3.43 mIU/L (ref 0.40–4.50)

## 2020-09-21 LAB — PSA: PSA: 1.01 ng/mL (ref <=4.00)

## 2020-10-09 ENCOUNTER — Other Ambulatory Visit: Payer: Self-pay | Admitting: Primary Care

## 2020-10-09 DIAGNOSIS — N400 Enlarged prostate without lower urinary tract symptoms: Secondary | ICD-10-CM

## 2020-10-12 LAB — COMPREHENSIVE METABOLIC PANEL
ALT: 24 IU/L (ref 0–44)
AST: 25 IU/L (ref 0–40)
Albumin/Globulin Ratio: 2.4 — ABNORMAL HIGH (ref 1.2–2.2)
Albumin: 4.7 g/dL (ref 3.8–4.8)
Alkaline Phosphatase: 75 IU/L (ref 44–121)
BUN/Creatinine Ratio: 11 (ref 10–24)
BUN: 11 mg/dL (ref 8–27)
Bilirubin Total: 0.6 mg/dL (ref 0.0–1.2)
CO2: 25 mmol/L (ref 20–29)
Calcium: 9.1 mg/dL (ref 8.6–10.2)
Chloride: 101 mmol/L (ref 96–106)
Creatinine, Ser: 1 mg/dL (ref 0.76–1.27)
Globulin, Total: 2 g/dL (ref 1.5–4.5)
Glucose: 111 mg/dL — ABNORMAL HIGH (ref 65–99)
Potassium: 4.7 mmol/L (ref 3.5–5.2)
Sodium: 139 mmol/L (ref 134–144)
Total Protein: 6.7 g/dL (ref 6.0–8.5)
eGFR: 85 mL/min/{1.73_m2} (ref >59)

## 2020-10-12 LAB — LIPID PANEL
Chol/HDL Ratio: 2.9 ratio (ref 0.0–5.0)
Cholesterol, Total: 136 mg/dL (ref 100–199)
HDL: 47 mg/dL (ref >39)
LDL Chol Calc (NIH): 70 mg/dL (ref 0–99)
Triglycerides: 101 mg/dL (ref 0–149)
VLDL Cholesterol Cal: 19 mg/dL (ref 5–40)

## 2020-10-17 ENCOUNTER — Ambulatory Visit: Payer: Commercial Managed Care - PPO | Admitting: Cardiology

## 2020-11-01 ENCOUNTER — Ambulatory Visit: Payer: Commercial Managed Care - PPO | Admitting: Cardiology

## 2020-11-03 ENCOUNTER — Other Ambulatory Visit: Payer: Self-pay | Admitting: Cardiology

## 2020-11-03 DIAGNOSIS — I2510 Atherosclerotic heart disease of native coronary artery without angina pectoris: Secondary | ICD-10-CM

## 2020-11-07 ENCOUNTER — Other Ambulatory Visit: Payer: Self-pay

## 2020-11-07 ENCOUNTER — Ambulatory Visit: Payer: Commercial Managed Care - PPO | Admitting: Cardiology

## 2020-11-07 ENCOUNTER — Encounter: Payer: Self-pay | Admitting: Cardiology

## 2020-11-07 VITALS — BP 117/73 | HR 70 | Temp 98.7°F | Ht 71.0 in | Wt 164.0 lb

## 2020-11-07 DIAGNOSIS — E782 Mixed hyperlipidemia: Secondary | ICD-10-CM

## 2020-11-07 DIAGNOSIS — I2510 Atherosclerotic heart disease of native coronary artery without angina pectoris: Secondary | ICD-10-CM

## 2020-11-07 NOTE — Progress Notes (Signed)
 Follow up visit  Subjective:   Cory Wong, male    DOB: 10/05/1955, 65 y.o.   MRN: [id number]   HPI   Chief Complaint  Patient presents with   Coronary Artery Disease   Follow-up    65 y/o Caucasian male with CAD s/p STEMI & primary PCI (07/2019), hypertension, hyperlipidemia, BPH, neuropathy.  Patient denies chest pain, shortness of breath, palpitations, leg edema, orthopnea, PND, TIA/syncope. He was recently treated for poison ivy. Reviewed recent lab results with the patient, details below.   Current Outpatient Medications on File Prior to Visit  Medication Sig Dispense Refill   atorvastatin (LIPITOR) 80 MG tablet TAKE 1 TABLET (80 MG TOTAL) BY MOUTH DAILY AT 6 PM. 30 tablet 11   [facility]S ASPIRIN ADULT LOW DOSE 81 MG chewable tablet CHEW 1 TABLET (81 MG TOTAL) BY MOUTH DAILY. 30 tablet 14   lisinopril (ZESTRIL) 5 MG tablet TAKE 1 TABLET BY MOUTH EVERY DAY 30 tablet 11   metoprolol succinate (TOPROL-XL) 25 MG 24 hr tablet TAKE 1 TABLET (25 MG TOTAL) BY MOUTH DAILY. TAKE WITH OR IMMEDIATELY FOLLOWING A MEAL. 30 tablet 11   Multiple Vitamin (MULTIVITAMIN) tablet Take 1 tablet by mouth daily.     pantoprazole (PROTONIX) 40 MG tablet TAKE 1 TABLET BY MOUTH EVERY DAY 30 tablet 11   predniSONE (DELTASONE) 20 MG tablet Take 3 tablets by mouth once daily for 2 days, then 2 tablets for 4 days, then 1 tablet for 4 days. 18 tablet 0   tamsulosin (FLOMAX) 0.4 MG CAPS capsule Take 1 capsule (0.4 mg total) by mouth daily. For urine flow. 90 capsule 3   No current facility-administered medications on file prior to visit.    Cardiovascular & other pertient studies:  EKG 07/24/2020: Sinus rhythm 69 bpm Normal EKG  Echocardiogram 07/27/2019:  1. Hypokiinesis of the basal inferior/inferolateral wall with overall  normal LV systolic function; trace AI; mild MR.   2. Left ventricular ejection fraction, by estimation, is 55 to 60%. The  left ventricle has normal function. The left ventricle  demonstrates  regional wall motion abnormalities (see scoring diagram/findings for  description). Left ventricular diastolic  parameters were normal.   3. Right ventricular systolic function is normal. The right ventricular  size is normal.   4. The mitral valve is normal in structure. Mild mitral valve  regurgitation. No evidence of mitral stenosis.   5. The aortic valve is tricuspid. Aortic valve regurgitation is trivial.  Mild aortic valve sclerosis is present, with no evidence of aortic valve  stenosis.   6. The inferior vena cava is normal in size with greater than 50%  respiratory variability, suggesting right atrial pressure of 3 mmHg.   Coronary intervention 07/26/2019: LM: Distal 20% stenosis LAD: Minimal luminal irregularities LCx: Normal RCA: Prox 100%, mid 75%, distal 50% stenosis        Aspiration thrombectomy        Successful percutaneous coronary intervention mid to ostial RCA        PTCA and overlapping stents placement.        Resolute Onyx 3.5 X 8 mm DES        Resolute Onyx 3.5 X 38 mm DES        Resolute Onyx 3.5 X 15 mm DES        Post dilatation with 4.0X15 mm [place] balloon There is slight under expansion in mid RCA due to calcium. This is <10%.   0% residual stenosis, TIMI   III flow.    Recent labs: 10/11/2020: Glucose 111, BUN/Cr 11/1.0. EGFR 85. Na/K 139/4.7. Rest of the CMP normal Chol 136, TG 101, HDL 47, LDL 70  07/02/2020: Chol 131, TG 112, HDL 36, LDL 74  08/10/2019: Chol 94, TG 82, HDL 30, LDL 47  07/27/2019: Glucose 127, BUN/Cr 10/0.98. EGFR >60. Na/K 138/4.8. AST/ALT 150/49. Rest of the CMP normal H/H 14/44. MCV 91. Platelets 233 HbA1C 5.6% Chol 203, TG 249, HDL 33, LDL 120 Lipoprotein a 62 normal TSH 3.7 normal   Review of Systems  Cardiovascular:  Negative for chest pain, dyspnea on exertion, leg swelling, palpitations and syncope.        Vitals:   11/07/20 1428  BP: 117/73  Pulse: 70  Temp: 98.7 F (37.1 C)  SpO2: 97%      Body mass index is 22.87 kg/m. Filed Weights   11/07/20 1428  Weight: 164 lb (74.4 kg)     Objective:   Physical Exam Vitals and nursing note reviewed.  Constitutional:      General: He is not in acute distress. Neck:     Vascular: No JVD.  Cardiovascular:     Rate and Rhythm: Normal rate and regular rhythm.     Pulses: Normal pulses.     Heart sounds: Normal heart sounds. No murmur heard. Pulmonary:     Effort: Pulmonary effort is normal.     Breath sounds: Normal breath sounds. No wheezing or rales.  Musculoskeletal:     Right lower leg: No edema.     Left lower leg: No edema.  Skin:    Comments: Ecchymosis on left ankle          Assessment & Recommendations:   65 y/o Caucasian male with CAD s/p STEMI & primary PCI (07/2019), hypertension, hyperlipidemia, BPH, neuropathy.  CAD: S/p primary PCI to prox-mid RCA (STEMI 07/2019). No angina symptoms. Continue Aspirin, Lipitor, Zetiametoprolol, lisinopril.  LDL 70, HDL 47.  F/u in 1 year  Nigel Mormon, MD [facility] Pager: [phone] Office: [phone]

## 2021-08-03 ENCOUNTER — Other Ambulatory Visit: Payer: Self-pay | Admitting: Cardiology

## 2021-08-03 DIAGNOSIS — I2510 Atherosclerotic heart disease of native coronary artery without angina pectoris: Secondary | ICD-10-CM

## 2021-08-07 ENCOUNTER — Encounter: Payer: Self-pay | Admitting: Family

## 2021-08-07 ENCOUNTER — Ambulatory Visit (INDEPENDENT_AMBULATORY_CARE_PROVIDER_SITE_OTHER): Payer: Commercial Managed Care - PPO | Admitting: Family

## 2021-08-07 VITALS — BP 118/70 | HR 50 | Temp 99.1°F | Resp 16 | Ht 71.0 in | Wt 169.6 lb

## 2021-08-07 DIAGNOSIS — L2389 Allergic contact dermatitis due to other agents: Secondary | ICD-10-CM

## 2021-08-07 DIAGNOSIS — K130 Diseases of lips: Secondary | ICD-10-CM | POA: Insufficient documentation

## 2021-08-07 DIAGNOSIS — R21 Rash and other nonspecific skin eruption: Secondary | ICD-10-CM

## 2021-08-07 MED ORDER — PREDNISONE 20 MG PO TABS: ORAL_TABLET | ORAL | 0 refills | Status: DC

## 2021-08-07 MED ORDER — MUPIROCIN 2 % EX OINT: 1.0000 "application " | TOPICAL_OINTMENT | Freq: Two times a day (BID) | CUTANEOUS | 0 refills | Status: AC

## 2021-08-07 NOTE — Assessment & Plan Note (Signed)
Suspected poison oak/ivy contact ?rx prednisone taper ?Handout given ? ?

## 2021-08-07 NOTE — Assessment & Plan Note (Signed)
Zyrtec prn for itching ? ?

## 2021-08-07 NOTE — Patient Instructions (Addendum)
Sent in RX for lower lip, twice daily  ?At night time apply Aquaphor.  ? ?If no improvement in one week , change to clotrimazole over the counter twice day to see if improvement.  ?If no improvement see dermatology.  ? ?It was a pleasure seeing you today! Please do not hesitate to reach out with any questions and or concerns. ? ?Regards,  ? ?Braniyah Besse ?FNP-C ? ?

## 2021-08-07 NOTE — Progress Notes (Signed)
? ?Established Patient Office Visit ? ?Subjective:  ?Patient ID: Cory Wong, male    DOB: 11-18-1955  Age: 66 y.o. MRN: [id number] ? ?CC:  ?Chief Complaint  ?Patient presents with  ? Poison Ivy  ?  X 2 week arm, legs, feet, and stomach.  ? ? ?HPI ?Cory Wong is here today with concerns.  ? ?Two weeks ago working around a bush, and he thinks it may have been poison oak/ivy/or sumac.  ?He states the rash is very itchy. He notices the rash on his bil lower extremities, feet, and lower legs as well as was on upper arms but improving. Still slight on stomach  ? ?Also states over the last two months every am with chapped lips, has improved slightly but still chapped. Cracks at times. Has been putting chapstick on lower lip, and goes to bed with chapped lips again.  ? ?Past Medical History:  ?Diagnosis Date  ? Acute myocardial infarction ([place]) 07/26/2019  ? Allergy   ? BPH (benign prostatic hyperplasia)   ? CAD (coronary artery disease)   ? Hemorrhoids   ? Hyperlipidemia   ? Hypertension   ? Peripheral neuropathy   ? Seborrhea   ? STEMI (ST elevation myocardial infarction) ([place])   ? Subclinical hypothyroidism   ? ? ?Past Surgical History:  ?Procedure Laterality Date  ? APPENDECTOMY    ? CORONARY STENT INTERVENTION N/A 07/26/2019  ? Procedure: CORONARY STENT INTERVENTION;  Surgeon: Nigel Mormon, MD;  Location: [place] [facility];  Service: Cardiovascular;  Laterality: N/A;  ? CORONARY THROMBECTOMY N/A 07/26/2019  ? Procedure: Coronary Thrombectomy;  Surgeon: Nigel Mormon, MD;  Location: [employer] [facility];  Service: Cardiovascular;  Laterality: N/A;  ? CORONARY/GRAFT ACUTE MI REVASCULARIZATION N/A 07/26/2019  ? Procedure: Coronary/Graft Acute MI Revascularization;  Surgeon: Nigel Mormon, MD;  Location: [place] [facility];  Service: Cardiovascular;  Laterality: N/A;  ? LEFT HEART CATH AND CORONARY ANGIOGRAPHY N/A 07/26/2019  ? Procedure: LEFT HEART CATH AND CORONARY ANGIOGRAPHY;  Surgeon:  Nigel Mormon, MD;  Location: [place] [facility];  Service: Cardiovascular;  Laterality: N/A;  ? ? ?Family History  ?Problem Relation Age of Onset  ? Heart attack Brother   ? Hyperlipidemia Mother   ? Diabetes Mother   ? Alzheimer's disease Father   ? ? ?Social History  ? ?Socioeconomic History  ? Marital status: Single  ?  Spouse name: Not on file  ? Number of children: 2  ? Years of education: Not on file  ? Highest education level: Not on file  ?Occupational History  ? Not on file  ?Tobacco Use  ? Smoking status: Former  ?  Packs/day: 1.00  ?  Years: 7.00  ?  Pack years: 7.00  ?  Types: Cigarettes  ?  Quit date: 38  ?  Years since quitting: 46.2  ? Smokeless tobacco: Never  ?Vaping Use  ? Vaping Use: Never used  ?Substance and Sexual Activity  ? Alcohol use: No  ? Drug use: No  ? Sexual activity: Not on file  ?Other Topics Concern  ? Not on file  ?Social History Narrative  ? Not on file  ? ?Social Determinants of Health  ? ?Financial Resource Strain: Not on file  ?Food Insecurity: Not on file  ?Transportation Needs: Not on file  ?Physical Activity: Not on file  ?Stress: Not on file  ?Social Connections: Not on file  ?Intimate Partner Violence: Not on file  ? ? ?  Outpatient Medications Prior to Visit  ?Medication Sig Dispense Refill  ? atorvastatin (LIPITOR) 80 MG tablet TAKE 1 TABLET BY MOUTH DAILY AT 6 PM. 30 tablet 11  ? CVS ASPIRIN ADULT LOW DOSE 81 MG chewable tablet CHEW 1 TABLET (81 MG TOTAL) BY MOUTH DAILY. 30 tablet 14  ? lisinopril (ZESTRIL) 5 MG tablet TAKE 1 TABLET BY MOUTH EVERY DAY 30 tablet 11  ? metoprolol succinate (TOPROL-XL) 25 MG 24 hr tablet TAKE 1 TABLET (25 MG TOTAL) BY MOUTH DAILY. TAKE WITH OR IMMEDIATELY FOLLOWING A MEAL. 30 tablet 11  ? Multiple Vitamin (MULTIVITAMIN) tablet Take 1 tablet by mouth daily.    ? pantoprazole (PROTONIX) 40 MG tablet TAKE 1 TABLET BY MOUTH EVERY DAY 30 tablet 11  ? tamsulosin (FLOMAX) 0.4 MG CAPS capsule Take 1 capsule (0.4 mg total) by mouth daily.  For urine flow. 90 capsule 3  ? ?No facility-administered medications prior to visit.  ? ? ?No Known Allergies ? ?ROS ?Review of Systems  ?Constitutional:  Negative for chills, fatigue and fever.  ?Skin:  Positive for rash (itchy and red on lower right leg, chapped lips with lower lip).  ? ?  ?Objective:  ?  ?Physical Exam ?Vitals reviewed.  ?Constitutional:   ?   General: He is not in acute distress. ?   Appearance: Normal appearance. He is normal weight. He is not ill-appearing, toxic-appearing or diaphoretic.  ?HENT:  ?   Mouth/Throat:  ?   Lips: Lesions (dry chapping with some small pinpoint papules on lower lip) present.  ?Skin: ?   Findings: Rash present. Rash is purpuric (multiple raised papular lesions, clustered and scattered on multiple ext, bil le as well as on trunk).  ?Neurological:  ?   Mental Status: He is alert.  ? ? ?BP 118/70   Pulse (!) 50   Temp 99.1 ?F (37.3 ?C)   Resp 16   Ht 5' 11"  (1.803 m)   Wt 169 lb 9 oz (76.9 kg)   SpO2 94%   BMI 23.65 kg/m?  ?Wt Readings from Last 3 Encounters:  ?08/07/21 169 lb 9 oz (76.9 kg)  ?11/07/20 164 lb (74.4 kg)  ?09/20/20 160 lb (72.6 kg)  ? ? ? ?Health Maintenance Due  ?Topic Date Due  ? HIV Screening  Never done  ? Hepatitis C Screening  Never done  ? TETANUS/TDAP  Never done  ? Zoster Vaccines- Shingrix (1 of 2) Never done  ? COLONOSCOPY (Pts 45-35yr Insurance coverage will need to be confirmed)  12/02/2013  ? ? ?There are no preventive care reminders to display for this patient. ? ?Lab Results  ?Component Value Date  ? TSH 3.43 09/20/2020  ? ?Lab Results  ?Component Value Date  ? WBC 10.2 07/27/2019  ? HGB 14.8 07/27/2019  ? HCT 44.5 07/27/2019  ? MCV 91.9 07/27/2019  ? PLT 233 07/27/2019  ? ?Lab Results  ?Component Value Date  ? NA 139 10/11/2020  ? K 4.7 10/11/2020  ? CO2 25 10/11/2020  ? GLUCOSE 111 (H) 10/11/2020  ? BUN 11 10/11/2020  ? CREATININE 1.00 10/11/2020  ? BILITOT 0.6 10/11/2020  ? ALKPHOS 75 10/11/2020  ? AST 25 10/11/2020  ? ALT 24  10/11/2020  ? PROT 6.7 10/11/2020  ? ALBUMIN 4.7 10/11/2020  ? CALCIUM 9.1 10/11/2020  ? ANIONGAP 10 07/27/2019  ? EGFR 85 10/11/2020  ? ?Lab Results  ?Component Value Date  ? HGBA1C 5.3 09/20/2020  ? ? ?  ?Assessment & Plan:  ? ?  Problem List Items Addressed This Visit   ? ?  ? Digestive  ? Angular cheilitis  ?  rx mupirocin ointment for trial ?Night time apply aquaphor ?If no improvement in one week change to trial clotrimazole otc ?If no improvement see derm ? ?Time in office for acute concerns and monitoring lab totaled 32 minutes ?  ?  ? Relevant Medications  ? mupirocin ointment (BACTROBAN) 2 %  ?  ? Musculoskeletal and Integument  ? Rash and nonspecific skin eruption  ?  Suspected poison oak/ivy contact ?rx prednisone taper ?Handout given ? ?  ?  ? Allergic contact dermatitis due to other agents - Primary  ?  Zyrtec prn for itching ? ?  ?  ? Relevant Medications  ? predniSONE (DELTASONE) 20 MG tablet  ? ? ?Meds ordered this encounter  ?Medications  ? predniSONE (DELTASONE) 20 MG tablet  ?  Sig: Take two tablets daily for 3 days followed by one tablet daily for 4 days  ?  Dispense:  10 tablet  ?  Refill:  0  ?  Order Specific Question:   Supervising Provider  ?  Answer:   BEDSOLE, AMY E [[id number]]  ? mupirocin ointment (BACTROBAN) 2 %  ?  Sig: Apply 1 application. topically 2 (two) times daily for 14 days.  ?  Dispense:  28 g  ?  Refill:  0  ?  Order Specific Question:   Supervising Provider  ?  Answer:   BEDSOLE, AMY E [[id number]]  ? ? ?Follow-up: Return if symptoms worsen or fail to improve.  ? ? ?Eugenia Pancoast, FNP ?

## 2021-08-07 NOTE — Assessment & Plan Note (Addendum)
rx mupirocin ointment for trial ?Night time apply aquaphor ?If no improvement in one week change to trial clotrimazole otc ?If no improvement see derm ? ?Time in office for acute concerns and monitoring lab totaled 32 minutes ?

## 2021-09-29 ENCOUNTER — Other Ambulatory Visit: Payer: Self-pay | Admitting: Primary Care

## 2021-09-29 DIAGNOSIS — N400 Enlarged prostate without lower urinary tract symptoms: Secondary | ICD-10-CM

## 2021-10-26 ENCOUNTER — Other Ambulatory Visit: Payer: Self-pay | Admitting: Primary Care

## 2021-10-26 DIAGNOSIS — N400 Enlarged prostate without lower urinary tract symptoms: Secondary | ICD-10-CM

## 2021-10-27 NOTE — Telephone Encounter (Signed)
Attempted to contact patient.

## 2021-11-07 ENCOUNTER — Encounter: Payer: Self-pay | Admitting: Cardiology

## 2021-11-07 ENCOUNTER — Ambulatory Visit: Payer: Commercial Managed Care - PPO | Admitting: Cardiology

## 2021-11-07 VITALS — BP 114/66 | HR 76 | Temp 98.0°F | Resp 16 | Ht 71.0 in | Wt 168.0 lb

## 2021-11-07 DIAGNOSIS — E782 Mixed hyperlipidemia: Secondary | ICD-10-CM

## 2021-11-07 DIAGNOSIS — I2510 Atherosclerotic heart disease of native coronary artery without angina pectoris: Secondary | ICD-10-CM

## 2021-11-07 NOTE — Progress Notes (Signed)
Follow up visit  Subjective:   Cory Wong, male    DOB: [birth year], 66 y.o.   MRN: [id number]   HPI   Chief Complaint  Patient presents with   Coronary Artery Disease   Hyperlipidemia   Follow-up    1 year    66 y/o Caucasian male with CAD s/p STEMI & primary PCI (07/2019), hypertension, hyperlipidemia, BPH, neuropathy.  Patient is doing well from cardiac standpoint. He denies chest pain, shortness of breath, palpitations, leg edema, orthopnea, PND, TIA/syncope.  I do not see any recent labs available.    Current Outpatient Medications:    atorvastatin (LIPITOR) 80 MG tablet, TAKE 1 TABLET BY MOUTH DAILY AT 6 PM., Disp: 30 tablet, Rfl: 11   [employer] ASPIRIN ADULT LOW DOSE 81 MG chewable tablet, CHEW 1 TABLET (81 MG TOTAL) BY MOUTH DAILY., Disp: 30 tablet, Rfl: 14   lisinopril (ZESTRIL) 5 MG tablet, TAKE 1 TABLET BY MOUTH EVERY DAY, Disp: 30 tablet, Rfl: 11   metoprolol succinate (TOPROL-XL) 25 MG 24 hr tablet, TAKE 1 TABLET (25 MG TOTAL) BY MOUTH DAILY. TAKE WITH OR IMMEDIATELY FOLLOWING A MEAL., Disp: 30 tablet, Rfl: 11   Multiple Vitamin (MULTIVITAMIN) tablet, Take 1 tablet by mouth daily., Disp: , Rfl:    pantoprazole (PROTONIX) 40 MG tablet, TAKE 1 TABLET BY MOUTH EVERY DAY, Disp: 30 tablet, Rfl: 11   predniSONE (DELTASONE) 20 MG tablet, Take two tablets daily for 3 days followed by one tablet daily for 4 days, Disp: 10 tablet, Rfl: 0   tamsulosin (FLOMAX) 0.4 MG CAPS capsule, TAKE 1 CAPSULE (0.4 MG TOTAL) BY MOUTH DAILY. FOR URINE FLOW. OFFICE VISIT REQUIRED FOR FURTHER REFILLS., Disp: 30 capsule, Rfl: 0  Cardiovascular & other pertient studies:  EKG 11/07/2021: EKG 11/07/2021: Sinus rhythm 72 bpm RSR(V1) -nondiagnostic  Echocardiogram 07/27/2019:  1. Hypokiinesis of the basal inferior/inferolateral wall with overall  normal LV systolic function; trace AI; mild MR.   2. Left ventricular ejection fraction, by estimation, is 55 to 60%. The  left ventricle has normal  function. The left ventricle demonstrates  regional wall motion abnormalities (see scoring diagram/findings for  description). Left ventricular diastolic  parameters were normal.   3. Right ventricular systolic function is normal. The right ventricular  size is normal.   4. The mitral valve is normal in structure. Mild mitral valve  regurgitation. No evidence of mitral stenosis.   5. The aortic valve is tricuspid. Aortic valve regurgitation is trivial.  Mild aortic valve sclerosis is present, with no evidence of aortic valve  stenosis.   6. The inferior vena cava is normal in size with greater than 50%  respiratory variability, suggesting right atrial pressure of 3 mmHg.   Coronary intervention 07/26/2019: LM: Distal 20% stenosis LAD: Minimal luminal irregularities LCx: Normal RCA: Prox 100%, mid 75%, distal 50% stenosis        Aspiration thrombectomy        Successful percutaneous coronary intervention mid to ostial RCA        PTCA and overlapping stents placement.        Resolute Onyx 3.5 X 8 mm DES        Resolute Onyx 3.5 X 38 mm DES        Resolute Onyx 3.5 X 15 mm DES        Post dilatation with 4.0X15 mm [place] balloon There is slight under expansion in mid RCA due to calcium. This is <10%.   0% residual stenosis,  TIMI III flow.    Recent labs: 10/11/2020: Glucose 111, BUN/Cr 11/1.0. EGFR 85. Na/K 139/4.7. Rest of the CMP normal Chol 136, TG 101, HDL 47, LDL 70  07/02/2020: Chol 131, TG 112, HDL 36, LDL 74  08/10/2019: Chol 94, TG 82, HDL 30, LDL 47  07/27/2019: Glucose 127, BUN/Cr 10/0.98. EGFR >60. Na/K 138/4.8. AST/ALT 150/49. Rest of the CMP normal H/H 14/44. MCV 91. Platelets 233 HbA1C 5.6% Chol 203, TG 249, HDL 33, LDL 120 Lipoprotein a 62 normal TSH 3.7 normal   Review of Systems  Cardiovascular:  Negative for chest pain, dyspnea on exertion, leg swelling, palpitations and syncope.         Vitals:   11/07/21 1400  BP: 114/66  Pulse: 76  Resp: 16   Temp: 98 F (36.7 C)  SpO2: 98%     Body mass index is 23.43 kg/m. Filed Weights   11/07/21 1400  Weight: 168 lb (76.2 kg)     Objective:   Physical Exam Vitals and nursing note reviewed.  Constitutional:      General: He is not in acute distress. Neck:     Vascular: No JVD.  Cardiovascular:     Rate and Rhythm: Normal rate and regular rhythm.     Pulses: Normal pulses.     Heart sounds: Normal heart sounds. No murmur heard. Pulmonary:     Effort: Pulmonary effort is normal.     Breath sounds: Normal breath sounds. No wheezing or rales.  Musculoskeletal:     Right lower leg: No edema.     Left lower leg: No edema.           Assessment & Recommendations:   66 y/o Caucasian male with CAD s/p STEMI & primary PCI (07/2019), hypertension, hyperlipidemia, BPH, neuropathy.  CAD: S/p primary PCI to prox-mid RCA (STEMI 07/2019). No angina symptoms. Continue Aspirin, Lipitor, metoprolol, lisinopril.  Check lipid panels   F/u in 1 year  Nigel Mormon, MD [facility] Pager: [phone] Office: [phone]

## 2021-11-08 LAB — LIPID PANEL
Chol/HDL Ratio: 2.9 ratio (ref 0.0–5.0)
Cholesterol, Total: 98 mg/dL — ABNORMAL LOW (ref 100–199)
HDL: 34 mg/dL — ABNORMAL LOW (ref >39)
LDL Chol Calc (NIH): 43 mg/dL (ref 0–99)
Triglycerides: 116 mg/dL (ref 0–149)
VLDL Cholesterol Cal: 21 mg/dL (ref 5–40)

## 2021-11-08 LAB — BASIC METABOLIC PANEL
BUN/Creatinine Ratio: 11 (ref 10–24)
BUN: 12 mg/dL (ref 8–27)
CO2: 25 mmol/L (ref 20–29)
Calcium: 9.2 mg/dL (ref 8.6–10.2)
Chloride: 105 mmol/L (ref 96–106)
Creatinine, Ser: 1.13 mg/dL (ref 0.76–1.27)
Glucose: 115 mg/dL — ABNORMAL HIGH (ref 70–99)
Potassium: 4.2 mmol/L (ref 3.5–5.2)
Sodium: 145 mmol/L — ABNORMAL HIGH (ref 134–144)
eGFR: 73 mL/min/{1.73_m2} (ref >59)

## 2021-11-27 ENCOUNTER — Other Ambulatory Visit: Payer: Self-pay | Admitting: Primary Care

## 2021-11-27 DIAGNOSIS — N400 Enlarged prostate without lower urinary tract symptoms: Secondary | ICD-10-CM

## 2021-12-01 ENCOUNTER — Other Ambulatory Visit: Payer: Self-pay | Admitting: Cardiology

## 2021-12-01 DIAGNOSIS — I2510 Atherosclerotic heart disease of native coronary artery without angina pectoris: Secondary | ICD-10-CM

## 2021-12-05 ENCOUNTER — Other Ambulatory Visit: Payer: Self-pay | Admitting: Primary Care

## 2021-12-05 DIAGNOSIS — N400 Enlarged prostate without lower urinary tract symptoms: Secondary | ICD-10-CM

## 2022-09-19 ENCOUNTER — Other Ambulatory Visit: Payer: Self-pay | Admitting: Cardiology

## 2022-09-19 DIAGNOSIS — I2510 Atherosclerotic heart disease of native coronary artery without angina pectoris: Secondary | ICD-10-CM

## 2022-11-06 ENCOUNTER — Encounter: Payer: Self-pay | Admitting: Cardiology

## 2022-11-09 ENCOUNTER — Encounter: Payer: Self-pay | Admitting: Cardiology

## 2022-11-09 ENCOUNTER — Ambulatory Visit: Payer: Commercial Managed Care - PPO | Admitting: Cardiology

## 2022-11-09 VITALS — BP 121/74 | HR 72 | Ht 71.0 in | Wt 168.0 lb

## 2022-11-09 DIAGNOSIS — I2510 Atherosclerotic heart disease of native coronary artery without angina pectoris: Secondary | ICD-10-CM

## 2022-11-09 DIAGNOSIS — E782 Mixed hyperlipidemia: Secondary | ICD-10-CM

## 2022-11-09 DIAGNOSIS — I10 Essential (primary) hypertension: Secondary | ICD-10-CM

## 2022-11-09 MED ORDER — ATORVASTATIN CALCIUM 80 MG PO TABS
ORAL_TABLET | ORAL | 3 refills | Status: DC
Start: 2022-11-09 — End: 2023-12-02

## 2022-11-09 MED ORDER — LISINOPRIL 5 MG PO TABS: 5.0000 mg | ORAL_TABLET | Freq: Every day | ORAL | 3 refills | Status: DC

## 2022-11-09 MED ORDER — METOPROLOL SUCCINATE ER 25 MG PO TB24: 25.0000 mg | ORAL_TABLET | Freq: Every day | ORAL | 3 refills | Status: DC

## 2022-11-09 MED ORDER — ASPIRIN 81 MG PO TBEC: 81.0000 mg | DELAYED_RELEASE_TABLET | Freq: Every day | ORAL | 3 refills | Status: AC

## 2022-11-09 NOTE — Progress Notes (Signed)
Follow up visit  Subjective:   Cory Wong, male    DOB: [birth year], 67 y.o.   MRN: [id number]   HPI   Chief Complaint  Patient presents with   Coronary Artery Disease   Follow-up    67 y/o Caucasian male with CAD s/p STEMI & primary PCI (07/2019), hypertension, hyperlipidemia, BPH, neuropathy.  Patient is doing well from cardiac standpoint. He denies chest pain, shortness of breath, palpitations, leg edema, orthopnea, PND, TIA/syncope.  I do not see any recent labs available. He is not doing as muhc biking as before due to the heat, but still stays active.     Current Outpatient Medications:    atorvastatin (LIPITOR) 80 MG tablet, TAKE 1 TABLET BY MOUTH DAILY AT 6 PM., Disp: 90 tablet, Rfl: 3   CVS ASPIRIN ADULT LOW DOSE 81 MG chewable tablet, CHEW 1 TABLET (81 MG TOTAL) BY MOUTH DAILY., Disp: 30 tablet, Rfl: 14   lisinopril (ZESTRIL) 5 MG tablet, TAKE 1 TABLET BY MOUTH EVERY DAY, Disp: 31 tablet, Rfl: 11   metoprolol succinate (TOPROL-XL) 25 MG 24 hr tablet, TAKE 1 TABLET BY MOUTH DAILY. TAKE WITH OR IMMEDIATELY FOLLOWING A MEAL., Disp: 30 tablet, Rfl: 11   Multiple Vitamin (MULTIVITAMIN) tablet, Take 1 tablet by mouth daily., Disp: , Rfl:    pantoprazole (PROTONIX) 40 MG tablet, Take 1 tablet by mouth daily., Disp: , Rfl:   Cardiovascular & other pertient studies:  EKG 11/09/2022: Sinus rhythm 73 bpm Normal EKG   Echocardiogram 07/27/2019:  1. Hypokiinesis of the basal inferior/inferolateral wall with overall  normal LV systolic function; trace AI; mild MR.   2. Left ventricular ejection fraction, by estimation, is 55 to 60%. The  left ventricle has normal function. The left ventricle demonstrates  regional wall motion abnormalities (see scoring diagram/findings for  description). Left ventricular diastolic  parameters were normal.   3. Right ventricular systolic function is normal. The right ventricular  size is normal.   4. The mitral valve is normal in structure.  Mild mitral valve  regurgitation. No evidence of mitral stenosis.   5. The aortic valve is tricuspid. Aortic valve regurgitation is trivial.  Mild aortic valve sclerosis is present, with no evidence of aortic valve  stenosis.   6. The inferior vena cava is normal in size with greater than 50%  respiratory variability, suggesting right atrial pressure of 3 mmHg.   Coronary intervention 07/26/2019: LM: Distal 20% stenosis LAD: Minimal luminal irregularities LCx: Normal RCA: Prox 100%, mid 75%, distal 50% stenosis        Aspiration thrombectomy        Successful percutaneous coronary intervention mid to ostial RCA        PTCA and overlapping stents placement.        Resolute Onyx 3.5 X 8 mm DES        Resolute Onyx 3.5 X 38 mm DES        Resolute Onyx 3.5 X 15 mm DES        Post dilatation with 4.0X15 mm [place] balloon There is slight under expansion in mid RCA due to calcium. This is <10%.   0% residual stenosis, TIMI III flow.    Recent labs: 11/07/2021: Glucose 115, BUN/Cr 12/1.13. EGFR 73. Na/K 145/4.2.  Chol 98, TG 116, HDL 34, LDL 43  07/27/2019: Glucose 127, BUN/Cr 10/0.98. EGFR >60. Na/K 138/4.8. AST/ALT 150/49. Rest of the CMP normal H/H 14/44. MCV 91. Platelets 233 HbA1C 5.6% Chol 203, TG  249, HDL 33, LDL 120 Lipoprotein a 62 normal TSH 3.7 normal   Review of Systems  Cardiovascular:  Negative for chest pain, dyspnea on exertion, leg swelling, palpitations and syncope.         Vitals:   11/09/22 1401  BP: 121/74  Pulse: 72  SpO2: 97%     Body mass index is 23.43 kg/m. Filed Weights   11/09/22 1401  Weight: 168 lb (76.2 kg)     Objective:   Physical Exam Vitals and nursing note reviewed.  Constitutional:      General: He is not in acute distress. Neck:     Vascular: No JVD.  Cardiovascular:     Rate and Rhythm: Normal rate and regular rhythm.     Pulses: Normal pulses.     Heart sounds: Normal heart sounds. No murmur heard. Pulmonary:      Effort: Pulmonary effort is normal.     Breath sounds: Normal breath sounds. No wheezing or rales.  Musculoskeletal:     Right lower leg: No edema.     Left lower leg: No edema.           Assessment & Recommendations:   67 y/o Caucasian male with CAD s/p STEMI & primary PCI (07/2019), hypertension, hyperlipidemia, BPH, neuropathy.  CAD: S/p primary PCI to prox-mid RCA (STEMI 07/2019). Mild LM disease. No angina symptoms. Continue Aspirin, Lipitor, metoprolol, lisinopril.  Medications refilled.  Check CBC, BMP, lipid panel.  F/u in 1 year   Elder Negus, MD Pager: [phone] Office: [phone]

## 2023-05-28 ENCOUNTER — Ambulatory Visit (INDEPENDENT_AMBULATORY_CARE_PROVIDER_SITE_OTHER): Payer: Commercial Managed Care - PPO | Admitting: Primary Care

## 2023-05-28 VITALS — BP 118/76 | HR 81 | Temp 99.7°F | Ht 71.0 in | Wt 170.0 lb

## 2023-05-28 DIAGNOSIS — J029 Acute pharyngitis, unspecified: Secondary | ICD-10-CM

## 2023-05-28 DIAGNOSIS — J069 Acute upper respiratory infection, unspecified: Secondary | ICD-10-CM | POA: Diagnosis not present

## 2023-05-28 LAB — POCT RAPID STREP A (OFFICE): Rapid Strep A Screen: NEGATIVE

## 2023-05-28 LAB — POC COVID19 BINAXNOW: SARS Coronavirus 2 Ag: NEGATIVE

## 2023-05-28 NOTE — Addendum Note (Signed)
Addended by: Eual Fines on: 05/28/2023 03:13 PM   Modules accepted: Orders

## 2023-05-28 NOTE — Patient Instructions (Signed)
Nasal Congestion/Ear Pressure/Sinus Pressure: Try using Flonase (fluticasone) nasal spray. Instill 1 spray in each nostril twice daily.   Start a daily antihistamine such as Zyrtec 10 mg.  This can help with throat drainage and runny nose.  Please schedule a physical to meet with me in June this year.  It was a pleasure to see you today!

## 2023-05-28 NOTE — Progress Notes (Signed)
Subjective:    Patient ID: Cory Wong, male    DOB: [birth year], 68 y.o.   MRN: [id number]  HPI  Cory Wong is a very pleasant 68 y.o. male with a history of hypothyroidism, hypertension, CAD, rash who presents today to discuss URI symptoms.  Symptom onset 4 days ago with rhinorrhea.  Yesterday he developed a sore throat, fatigue, nasal congestion with large quantities of yellow mucous. This morning he was able to expel a large quantity of mucous.   He's been Dayquil. He has not tested for Covid-19 infection.   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 Today he's feeling better.    Review of Systems  Constitutional:  Positive for fatigue. Negative for chills and fever.  HENT:  Positive for congestion, postnasal drip, sinus pressure and sore throat.   Respiratory:  Negative for cough.   Neurological:  Negative for headaches.         Past Medical History:  Diagnosis Date   Acute myocardial infarction (HCC) 07/26/2019   Allergy    BPH (benign prostatic hyperplasia)    CAD (coronary artery disease)    Hemorrhoids    Hyperlipidemia    Hypertension    Peripheral neuropathy    Seborrhea    STEMI (ST elevation myocardial infarction) (HCC)    Subclinical hypothyroidism     Social History   Socioeconomic History   Marital status: Single    Spouse name: Not on file   Number of children: 2   Years of education: Not on file   Highest education level: Not on file  Occupational History   Not on file  Tobacco Use   Smoking status: Former    Current packs/day: 0.00    Average packs/day: 1 pack/day for 7.0 years (7.0 ttl pk-yrs)    Types: Cigarettes    Start date: 43    Quit date: 76    Years since quitting: 48.0   Smokeless tobacco: Never  Vaping Use   Vaping status: Never Used  Substance and Sexual Activity   Alcohol use: No   Drug use: No   Sexual activity: Not on file  Other Topics Concern    Not on file  Social History Narrative   Not on file   Social Drivers of Health   Financial Resource Strain: Not on file  Food Insecurity: Not on file  Transportation Needs: Not on file  Physical Activity: Not on file  Stress: Not on file  Social Connections: Not on file  Intimate Partner Violence: Not on file    Past Surgical History:  Procedure Laterality Date   APPENDECTOMY     CORONARY STENT INTERVENTION N/A 07/26/2019   Procedure: CORONARY STENT INTERVENTION;  Surgeon: Elder Negus, MD;  Location: [facility];  Service: Cardiovascular;  Laterality: N/A;   CORONARY THROMBECTOMY N/A 07/26/2019   Procedure: Coronary Thrombectomy;  Surgeon: Elder Negus, MD;  Location: [facility];  Service: Cardiovascular;  Laterality: N/A;   CORONARY/GRAFT ACUTE MI REVASCULARIZATION N/A 07/26/2019   Procedure: Coronary/Graft Acute MI Revascularization;  Surgeon: Elder Negus, MD;  Location: [facility];  Service: Cardiovascular;  Laterality: N/A;   LEFT HEART CATH AND CORONARY ANGIOGRAPHY N/A 07/26/2019   Procedure: LEFT HEART CATH AND CORONARY ANGIOGRAPHY;  Surgeon: Elder Negus, MD;  Location: [facility];  Service: Cardiovascular;  Laterality: N/A;    Family History  Problem Relation Age of Onset   Heart attack Brother    Hyperlipidemia Mother    Diabetes Mother    Alzheimer's disease Father     No Known Allergies  Current Outpatient Medications on File Prior to Visit  Medication Sig Dispense Refill   aspirin EC 81 MG tablet Take 1 tablet (81 mg total) by mouth daily. Swallow whole. 90 tablet 3   atorvastatin (LIPITOR) 80 MG tablet TAKE 1 TABLET BY MOUTH DAILY AT 6 PM. 90 tablet 3   lisinopril (ZESTRIL) 5 MG tablet Take 1 tablet (5 mg total) by mouth daily. 90 tablet 3   metoprolol succinate (TOPROL-XL) 25 MG 24 hr tablet Take 1 tablet (25 mg total) by mouth daily. TAKE WITH OR IMMEDIATELY FOLLOWING A MEAL. 90 tablet 3    Multiple Vitamin (MULTIVITAMIN) tablet Take 1 tablet by mouth daily.     pantoprazole (PROTONIX) 40 MG tablet Take 1 tablet by mouth daily.     No current facility-administered medications on file prior to visit.    BP 118/76 (BP Location: Left Arm, Patient Position: Sitting, Cuff Size: Normal)   Pulse 81   Temp 99.7 F (37.6 C) (Oral)   Ht 5\' 11"  (1.803 m)   Wt 170 lb (77.1 kg)   SpO2 95%   BMI 23.71 kg/m  Objective:   Physical Exam Constitutional:      Appearance: He is not ill-appearing.  HENT:     Right Ear: Tympanic membrane and ear canal normal.     Left Ear: Tympanic membrane and ear canal normal.     Nose: No mucosal edema.     Right Sinus: No maxillary sinus tenderness or frontal sinus tenderness.     Left Sinus: No maxillary sinus tenderness or frontal sinus tenderness.     Mouth/Throat:     Mouth: Mucous membranes are moist.  Eyes:     Conjunctiva/sclera: Conjunctivae normal.  Cardiovascular:     Rate and Rhythm: Normal rate and regular rhythm.  Pulmonary:     Effort: Pulmonary effort is normal.     Breath sounds: Normal breath sounds. No wheezing or rhonchi.  Musculoskeletal:     Cervical back: Neck supple.  Skin:    General: Skin is warm and dry.           Assessment & Plan:  Viral URI Assessment & Plan: Symptoms suggestive of allergy versus viral etiology. Fortunately, he is feeling better today.  Respiratory exam reassuring.  Discussed options for conservative treatment.  Start Zyrtec 10 mg at bedtime. Start Flonase twice daily as needed.  Return precautions provided.         Doreene Nest, NP

## 2023-05-28 NOTE — Assessment & Plan Note (Addendum)
Symptoms suggestive of allergy versus viral etiology. Fortunately, he is feeling better today.  Respiratory exam reassuring. Negative COVID-19 test and strep test today.  Discussed options for conservative treatment.  Start Zyrtec 10 mg at bedtime. Start Flonase twice daily as needed.  Return precautions provided.

## 2023-08-27 ENCOUNTER — Other Ambulatory Visit: Payer: Self-pay | Admitting: Cardiology

## 2023-08-27 DIAGNOSIS — I2510 Atherosclerotic heart disease of native coronary artery without angina pectoris: Secondary | ICD-10-CM

## 2023-09-10 ENCOUNTER — Other Ambulatory Visit: Payer: Self-pay | Admitting: Cardiology

## 2023-09-10 DIAGNOSIS — I2510 Atherosclerotic heart disease of native coronary artery without angina pectoris: Secondary | ICD-10-CM

## 2023-10-26 ENCOUNTER — Ambulatory Visit: Payer: Commercial Managed Care - PPO | Admitting: Primary Care

## 2023-10-28 ENCOUNTER — Encounter: Payer: Self-pay | Admitting: Primary Care

## 2023-10-28 ENCOUNTER — Ambulatory Visit (INDEPENDENT_AMBULATORY_CARE_PROVIDER_SITE_OTHER)

## 2023-10-28 ENCOUNTER — Other Ambulatory Visit: Payer: Self-pay | Admitting: Primary Care

## 2023-10-28 ENCOUNTER — Ambulatory Visit: Admitting: Primary Care

## 2023-10-28 ENCOUNTER — Ambulatory Visit: Payer: Self-pay | Admitting: Primary Care

## 2023-10-28 VITALS — BP 126/74 | HR 74 | Temp 97.3°F | Ht 71.0 in | Wt 172.0 lb

## 2023-10-28 DIAGNOSIS — Z0000 Encounter for general adult medical examination without abnormal findings: Secondary | ICD-10-CM | POA: Diagnosis not present

## 2023-10-28 DIAGNOSIS — Z125 Encounter for screening for malignant neoplasm of prostate: Secondary | ICD-10-CM | POA: Diagnosis not present

## 2023-10-28 DIAGNOSIS — E782 Mixed hyperlipidemia: Secondary | ICD-10-CM

## 2023-10-28 DIAGNOSIS — I2510 Atherosclerotic heart disease of native coronary artery without angina pectoris: Secondary | ICD-10-CM

## 2023-10-28 DIAGNOSIS — E039 Hypothyroidism, unspecified: Secondary | ICD-10-CM

## 2023-10-28 DIAGNOSIS — I10 Essential (primary) hypertension: Secondary | ICD-10-CM

## 2023-10-28 DIAGNOSIS — Z1211 Encounter for screening for malignant neoplasm of colon: Secondary | ICD-10-CM

## 2023-10-28 DIAGNOSIS — R739 Hyperglycemia, unspecified: Secondary | ICD-10-CM

## 2023-10-28 LAB — COMPREHENSIVE METABOLIC PANEL WITH GFR
ALT: 20 U/L (ref 0–53)
AST: 20 U/L (ref 0–37)
Albumin: 4.5 g/dL (ref 3.5–5.2)
Alkaline Phosphatase: 57 U/L (ref 39–117)
BUN: 15 mg/dL (ref 6–23)
CO2: 31 meq/L (ref 19–32)
Calcium: 9.3 mg/dL (ref 8.4–10.5)
Chloride: 105 meq/L (ref 96–112)
Creatinine, Ser: 1.18 mg/dL (ref 0.40–1.50)
GFR: 64.37 mL/min (ref >60.00)
Glucose, Bld: 114 mg/dL — ABNORMAL HIGH (ref 70–99)
Potassium: 4.1 meq/L (ref 3.5–5.1)
Sodium: 142 meq/L (ref 135–145)
Total Bilirubin: 0.7 mg/dL (ref 0.2–1.2)
Total Protein: 6.5 g/dL (ref 6.0–8.3)

## 2023-10-28 LAB — PSA: PSA: 1.59 ng/mL (ref 0.10–4.00)

## 2023-10-28 LAB — LIPID PANEL
Cholesterol: 95 mg/dL (ref 0–200)
HDL: 28.4 mg/dL — ABNORMAL LOW (ref >39.00)
LDL Cholesterol: 36 mg/dL (ref 0–99)
NonHDL: 66.77
Total CHOL/HDL Ratio: 3
Triglycerides: 154 mg/dL — ABNORMAL HIGH (ref 0.0–149.0)
VLDL: 30.8 mg/dL (ref 0.0–40.0)

## 2023-10-28 LAB — CBC
HCT: 42.7 % (ref 39.0–52.0)
Hemoglobin: 14.7 g/dL (ref 13.0–17.0)
MCHC: 34.5 g/dL (ref 30.0–36.0)
MCV: 89.2 fl (ref 78.0–100.0)
Platelets: 172 10*3/uL (ref 150.0–400.0)
RBC: 4.79 Mil/uL (ref 4.22–5.81)
RDW: 13.1 % (ref 11.5–15.5)
WBC: 6.2 10*3/uL (ref 4.0–10.5)

## 2023-10-28 LAB — T4, FREE: Free T4: 0.91 ng/dL (ref 0.60–1.60)

## 2023-10-28 LAB — TSH: TSH: 8.6 u[IU]/mL — ABNORMAL HIGH (ref 0.35–5.50)

## 2023-10-28 NOTE — Assessment & Plan Note (Signed)
 Asymptomatic.  Continue atorvastatin  80 mg daily, metoprolol  XL 25 mg daily, lisinopril  5 mg daily.  Labs pending.

## 2023-10-28 NOTE — Assessment & Plan Note (Signed)
 Declines all immunizations  Colonoscopy overdue, referral placed to GI PSA due and pending.  Discussed the importance of a healthy diet and regular exercise in order for weight loss, and to reduce the risk of further co-morbidity.  Exam stable. Labs pending.  Follow up in 1 year for repeat physical.

## 2023-10-28 NOTE — Assessment & Plan Note (Signed)
Repeat lipid panel pending. Continue atorvastatin 80 mg daily.

## 2023-10-28 NOTE — Patient Instructions (Signed)
 Stop by the lab prior to leaving today. I will notify you of your results once received.   You will receive a phone call for the colonoscopy.  It was a pleasure to see you today!

## 2023-10-28 NOTE — Progress Notes (Signed)
 Subjective:    Patient ID: Cory Wong, male    DOB: June 09, 1955, 68 y.o.   MRN: [id number]  HPI  Cory Wong is a very pleasant 68 y.o. male who presents today for complete physical and follow up of chronic conditions.  Immunizations: -Shingles: Never completed, declines  -Pneumonia: Nev previous episodeer completed, declines  Tetanus: Declines   Diet: Fair diet.  Exercise: No regular exercise.  Eye exam: Completed > 1 year ago Dental exam: Completed years ago    Colonoscopy: Completed in 2005   PSA: Due   BP Readings from Last 3 Encounters:  10/28/23 126/74  05/28/23 118/76  11/09/22 121/74        Review of Systems  Constitutional:  Negative for unexpected weight change.  HENT:  Negative for rhinorrhea.   Respiratory:  Negative for cough and shortness of breath.   Cardiovascular:  Negative for chest pain.  Gastrointestinal:  Negative for constipation and diarrhea.  Genitourinary:  Negative for difficulty urinating.  Musculoskeletal:  Negative for arthralgias and myalgias.  Skin:  Negative for rash.  Allergic/Immunologic: Negative for environmental allergies.  Neurological:  Negative for dizziness and headaches.  Psychiatric/Behavioral:  The patient is not nervous/anxious.          Past Medical History:  Diagnosis Date   Acute myocardial infarction (HCC) 07/26/2019   Allergy    BPH (benign prostatic hyperplasia)    CAD (coronary artery disease)    Depression 09/11/2013   Hemorrhoids    Hyperlipidemia    Hypertension    Peripheral neuropathy    Seborrhea    STEMI (ST elevation myocardial infarction) (HCC)    Subclinical hypothyroidism     Social History   Socioeconomic History   Marital status: Single    Spouse name: Not on file   Number of children: 2   Years of education: Not on file   Highest education level: Not on file  Occupational History   Not on file  Tobacco Use   Smoking status: Former    Current packs/day: 0.00     Average packs/day: 1 pack/day for 7.0 years (7.0 ttl pk-yrs)    Types: Cigarettes    Start date: 26    Quit date: 49    Years since quitting: 48.5   Smokeless tobacco: Never  Vaping Use   Vaping status: Never Used  Substance and Sexual Activity   Alcohol use: No   Drug use: No   Sexual activity: Not on file  Other Topics Concern   Not on file  Social History Narrative   Not on file   Social Drivers of Health   Financial Resource Strain: Not on file  Food Insecurity: Not on file  Transportation Needs: Not on file  Physical Activity: Not on file  Stress: Not on file  Social Connections: Not on file  Intimate Partner Violence: Not on file    Past Surgical History:  Procedure Laterality Date   APPENDECTOMY     CORONARY STENT INTERVENTION N/A 07/26/2019   Procedure: CORONARY STENT INTERVENTION;  Surgeon: Elmira Newman PARAS, MD;  Location: [facility];  Service: Cardiovascular;  Laterality: N/A;   CORONARY THROMBECTOMY N/A 07/26/2019   Procedure: Coronary Thrombectomy;  Surgeon: Elmira Newman PARAS, MD;  Location: [facility];  Service: Cardiovascular;  Laterality: N/A;   CORONARY/GRAFT ACUTE MI REVASCULARIZATION N/A 07/26/2019   Procedure: Coronary/Graft Acute MI Revascularization;  Surgeon: Elmira Newman PARAS, MD;  Location: [facility];  Service: Cardiovascular;  Laterality: N/A;   LEFT HEART CATH AND CORONARY ANGIOGRAPHY N/A 07/26/2019   Procedure: LEFT HEART CATH AND CORONARY ANGIOGRAPHY;  Surgeon: Elmira Newman PARAS, MD;  Location: [facility];  Service: Cardiovascular;  Laterality: N/A;    Family History  Problem Relation Age of Onset   Heart attack Brother    Hyperlipidemia Mother    Diabetes Mother    Alzheimer's disease Father     No Known Allergies  Current Outpatient Medications on File Prior to Visit  Medication Sig Dispense Refill   aspirin  EC 81 MG tablet Take 1 tablet (81 mg total) by mouth daily. Swallow whole. 90  tablet 3   atorvastatin  (LIPITOR ) 80 MG tablet TAKE 1 TABLET BY MOUTH DAILY AT 6 PM. 90 tablet 3   lisinopril  (ZESTRIL ) 5 MG tablet Take 1 tablet (5 mg total) by mouth daily. 90 tablet 3   metoprolol  succinate (TOPROL -XL) 25 MG 24 hr tablet TAKE 1 TABLET BY MOUTH DAILY. TAKE WITH OR IMMEDIATELY FOLLOWING A MEAL. 90 tablet 0   Multiple Vitamin (MULTIVITAMIN) tablet Take 1 tablet by mouth daily.     No current facility-administered medications on file prior to visit.    BP 126/74   Pulse 74   Temp (!) 97.3 F (36.3 C) (Temporal)   Ht 5' 11 (1.803 m)   Wt 172 lb (78 kg)   SpO2 97%   BMI 23.99 kg/m  Objective:   Physical Exam HENT:     Right Ear: Tympanic membrane and ear canal normal.     Left Ear: Tympanic membrane and ear canal normal.   Eyes:     Pupils: Pupils are equal, round, and reactive to light.    Cardiovascular:     Rate and Rhythm: Normal rate and regular rhythm.  Pulmonary:     Effort: Pulmonary effort is normal.     Breath sounds: Normal breath sounds.  Abdominal:     General: Bowel sounds are normal.     Palpations: Abdomen is soft.     Tenderness: There is no abdominal tenderness.   Musculoskeletal:        General: Normal range of motion.     Cervical back: Neck supple.   Skin:    General: Skin is warm and dry.   Neurological:     Mental Status: He is alert and oriented to person, place, and time.     Cranial Nerves: No cranial nerve deficit.     Deep Tendon Reflexes:     Reflex Scores:      Patellar reflexes are 2+ on the right side and 2+ on the left side.  Psychiatric:        Mood and Affect: Mood normal.           Assessment & Plan:  Preventative health care Assessment & Plan: Declines all immunizations  Colonoscopy overdue, referral placed to GI PSA due and pending.  Discussed the importance of a healthy diet and regular exercise in order for weight loss, and to reduce the risk of further co-morbidity.  Exam stable. Labs  pending.  Follow up in 1 year for repeat physical.    Coronary artery disease involving native coronary artery of native heart without angina pectoris Assessment & Plan: Asymptomatic.  Continue atorvastatin  80 mg daily, metoprolol  XL 25 mg daily, lisinopril  5 mg daily.  Labs pending.   Primary hypertension Assessment & Plan: Controlled.  Continue lisinopril  5 mg daily and metoprolol  succinate 25 mg daily.  Orders: -  Comprehensive metabolic panel with GFR -     CBC  Hypothyroidism, unspecified type Assessment & Plan: Off levothyroxine . Remain off.  Repeat TSH pending.  Orders: -     TSH  Mixed hyperlipidemia Assessment & Plan: Repeat lipid panel pending.  Continue atorvastatin  80 mg daily.  Orders: -     Lipid panel  Screening for colon cancer -     Ambulatory referral to Gastroenterology  Screening for prostate cancer -     PSA        Comer MARLA Gaskins, NP

## 2023-10-28 NOTE — Assessment & Plan Note (Signed)
 Off levothyroxine . Remain off.  Repeat TSH pending.

## 2023-10-28 NOTE — Assessment & Plan Note (Signed)
 Controlled.  Continue lisinopril  5 mg daily and metoprolol  succinate 25 mg daily.

## 2023-10-29 ENCOUNTER — Ambulatory Visit: Payer: Self-pay | Admitting: Primary Care

## 2023-10-29 ENCOUNTER — Other Ambulatory Visit: Payer: Self-pay | Admitting: Primary Care

## 2023-10-29 DIAGNOSIS — R7303 Prediabetes: Secondary | ICD-10-CM

## 2023-10-29 LAB — HEMOGLOBIN A1C: Hgb A1c MFr Bld: 5.9 % (ref 4.6–6.5)

## 2023-11-08 ENCOUNTER — Telehealth: Payer: Self-pay | Admitting: *Deleted

## 2023-11-08 NOTE — Telephone Encounter (Signed)
 Copied from CRM [id number]. Topic: Clinical - Lab/Test Results >> Nov 08, 2023  4:07 PM Chiquita SQUIBB wrote: Reason for CRM: Patient is calling Kelli back, patient stated he could not hear her on the  phone earlier due to being at work.

## 2023-11-09 NOTE — Telephone Encounter (Signed)
 Called and reviewed lab results with patient again, he verbalized understanding. Scheduled 6 mo lab appt.

## 2023-12-01 ENCOUNTER — Other Ambulatory Visit: Payer: Self-pay | Admitting: Cardiology

## 2023-12-01 DIAGNOSIS — I2510 Atherosclerotic heart disease of native coronary artery without angina pectoris: Secondary | ICD-10-CM

## 2023-12-24 ENCOUNTER — Other Ambulatory Visit: Payer: Self-pay | Admitting: Cardiology

## 2023-12-24 DIAGNOSIS — I2510 Atherosclerotic heart disease of native coronary artery without angina pectoris: Secondary | ICD-10-CM

## 2023-12-31 ENCOUNTER — Telehealth: Payer: Self-pay | Admitting: Primary Care

## 2023-12-31 NOTE — Telephone Encounter (Signed)
 Pt is having problems with having medications not being refilled, those medications he handed me is the atorvastain 80 mg tablet and lisinopril  5 mg tablet. He would like a call back if there's any updates and concerns at [phone]

## 2023-12-31 NOTE — Telephone Encounter (Signed)
 Advised patient these medications are prescribed cardiology and per their note they provided 15 tablets on 8.22.25 of each medication due to patient being overdue for f/u at their office. Patient will schedule appt to f/u with cardiology.

## 2024-01-07 ENCOUNTER — Other Ambulatory Visit: Payer: Self-pay | Admitting: Cardiology

## 2024-01-07 DIAGNOSIS — I2510 Atherosclerotic heart disease of native coronary artery without angina pectoris: Secondary | ICD-10-CM

## 2024-01-19 ENCOUNTER — Other Ambulatory Visit: Payer: Self-pay | Admitting: Cardiology

## 2024-01-19 DIAGNOSIS — I2510 Atherosclerotic heart disease of native coronary artery without angina pectoris: Secondary | ICD-10-CM

## 2024-02-07 NOTE — Telephone Encounter (Unsigned)
 Copied from CRM [id number]. Topic: Clinical - Medication Refill >> Feb 07, 2024  4:28 PM Harlene ORN wrote: Medication:  atorvastatin  (LIPITOR ) 80 MG tablet  lisinopril  (ZESTRIL ) 5 MG tablet metoprolol  succinate (TOPROL -XL) 25 MG 24 hr tablet  Has the patient contacted their pharmacy? Yes (Agent: If no, request that the patient contact the pharmacy for the refill. If patient does not wish to contact the pharmacy document the reason why and proceed with request.) (Agent: If yes, when and what did the pharmacy advise?)  This is the patient's preferred pharmacy:  [id number] - [place], [place] - [address] AT [facility] [address] [place] [address] Phone: [phone] Fax: [phone]  Is this the correct pharmacy for this prescription? Yes If no, delete pharmacy and type the correct one.   Has the prescription been filled recently? No  Is the patient out of the medication? Yes  Has the patient been seen for an appointment in the last year OR does the patient have an upcoming appointment? Yes  Can we respond through MyChart? No  Agent: Please be advised that Rx refills may take up to 3 business days. We ask that you follow-up with your pharmacy.

## 2024-02-09 ENCOUNTER — Other Ambulatory Visit: Payer: Self-pay | Admitting: Cardiology

## 2024-02-09 DIAGNOSIS — I2510 Atherosclerotic heart disease of native coronary artery without angina pectoris: Secondary | ICD-10-CM

## 2024-02-11 NOTE — Telephone Encounter (Signed)
 Pt has not been seen since merging with [facility] and pt has had attempts and had not done so. Would Dr. Elmira like to refill pt's medication without pt being seen? Please address

## 2024-02-11 NOTE — Telephone Encounter (Signed)
 Latest refill for 90 days with 1 refill and no further refills unless patient sees us .  Please give him 1 more chance to set up a nonurgent appointment with me.  Thanks MJP

## 2024-02-11 NOTE — Telephone Encounter (Signed)
 Please advise if okay with refill.

## 2024-03-02 ENCOUNTER — Other Ambulatory Visit: Payer: Self-pay | Admitting: Cardiology

## 2024-03-02 DIAGNOSIS — I2510 Atherosclerotic heart disease of native coronary artery without angina pectoris: Secondary | ICD-10-CM

## 2024-03-16 ENCOUNTER — Other Ambulatory Visit: Payer: Self-pay | Admitting: Cardiology

## 2024-03-16 DIAGNOSIS — I2510 Atherosclerotic heart disease of native coronary artery without angina pectoris: Secondary | ICD-10-CM

## 2024-03-16 NOTE — Telephone Encounter (Signed)
**  Patient need make an appointment for further refills.NO MORE REFILLS LAST ATTEMPT**  [phone]

## 2024-04-05 ENCOUNTER — Other Ambulatory Visit: Payer: Self-pay | Admitting: Cardiology

## 2024-04-05 DIAGNOSIS — I2510 Atherosclerotic heart disease of native coronary artery without angina pectoris: Secondary | ICD-10-CM

## 2024-04-10 MED ORDER — METOPROLOL SUCCINATE ER 25 MG PO TB24: 25.0000 mg | ORAL_TABLET | Freq: Every day | ORAL | 0 refills | Status: DC

## 2024-04-11 ENCOUNTER — Other Ambulatory Visit: Payer: Self-pay | Admitting: Cardiology

## 2024-04-11 DIAGNOSIS — I2510 Atherosclerotic heart disease of native coronary artery without angina pectoris: Secondary | ICD-10-CM

## 2024-04-17 ENCOUNTER — Ambulatory Visit: Payer: Self-pay
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
## 2024-04-17 NOTE — Telephone Encounter (Signed)
 Noted, will evaluate.

## 2024-04-20 ENCOUNTER — Ambulatory Visit: Payer: Self-pay | Admitting: Primary Care

## 2024-04-20 ENCOUNTER — Encounter: Payer: Self-pay | Admitting: Primary Care

## 2024-04-20 ENCOUNTER — Ambulatory Visit: Admitting: Primary Care

## 2024-04-20 VITALS — BP 130/82 | HR 81 | Temp 97.8°F | Ht 71.0 in | Wt 162.2 lb

## 2024-04-20 DIAGNOSIS — M546 Pain in thoracic spine: Secondary | ICD-10-CM | POA: Diagnosis not present

## 2024-04-20 DIAGNOSIS — R051 Acute cough: Secondary | ICD-10-CM | POA: Diagnosis not present

## 2024-04-20 DIAGNOSIS — G8929 Other chronic pain: Secondary | ICD-10-CM | POA: Insufficient documentation

## 2024-04-20 DIAGNOSIS — I2510 Atherosclerotic heart disease of native coronary artery without angina pectoris: Secondary | ICD-10-CM | POA: Diagnosis not present

## 2024-04-20 DIAGNOSIS — R52 Pain, unspecified: Secondary | ICD-10-CM

## 2024-04-20 LAB — POCT INFLUENZA A/B
Influenza A, POC: NEGATIVE
Influenza B, POC: NEGATIVE

## 2024-04-20 LAB — POC COVID19 BINAXNOW: SARS Coronavirus 2 Ag: NEGATIVE

## 2024-04-20 MED ORDER — LISINOPRIL 5 MG PO TABS: 5.0000 mg | ORAL_TABLET | Freq: Every day | ORAL | 1 refills | Status: AC

## 2024-04-20 MED ORDER — METOPROLOL SUCCINATE ER 25 MG PO TB24: 25.0000 mg | ORAL_TABLET | Freq: Every day | ORAL | 1 refills | Status: AC

## 2024-04-20 NOTE — Assessment & Plan Note (Signed)
 Symptoms representative of viral illness.  Rapid influenza and Covid-19 tests negative today.  Fortunately, he is feeling better. We discussed the trajectory of viral illness.  Continue Tylenol , rest, fluid. He will update next week if his symptoms do not resolve or if they progress.

## 2024-04-20 NOTE — Patient Instructions (Signed)
 Continue taking Tylenol  as needed for back pain and symptoms.  I sent refills of your lisinopril  and metoprolol  to the pharmacy.  Please schedule a physical to meet with me in 6 months.   It was a pleasure to see you today!

## 2024-04-20 NOTE — Assessment & Plan Note (Signed)
 Acute on chronic.  Improving which is good. We discussed Tylenol  use and other conservative treatment.

## 2024-04-20 NOTE — Progress Notes (Signed)
 Subjective:    Patient ID: Cory Wong, male    DOB: 24-Sep-1955, 68 y.o.   MRN: [id number]  Cory Wong is a very pleasant 68 y.o. male with a history of hypertension, CAD, peripheral neuropathy, BPH who presents today to discuss back pain and cough. He would also like to discuss metoprolol .   About one week ago he developed bilateral mid thoracic back pain. He works in an active role at work, manufacturing engineer throughout the day. He is thinking about retiring. He has been taking a medication that his daughter gave him which has helped. He doesn't recall the name.  He has a chronic history of thoracic back pain that occurs intermittently when working. Sometimes radiates from the scapular region to lumbar spine.   Simultaneously he began to experience rhinorrhea. Three days ago he had a sudden onset of headaches, body aches, dry cough. He's taken Tylenol  with temporary improvement. He has been out of work since Monday. He's feeling better today.  Currently managed on metoprolol  succinate 25 mg daily per cardiology, last office visit with cardiology was in July 2024. His cardiologist has left and the entire practice has closed permanently.   BP Readings from Last 3 Encounters:  04/20/24 130/82  10/28/23 126/74  05/28/23 118/76       Review of Systems  Constitutional:  Positive for chills and fatigue. Negative for fever.  HENT:  Positive for congestion and rhinorrhea.   Respiratory:  Positive for cough.   Musculoskeletal:  Positive for back pain and myalgias.         Past Medical History:  Diagnosis Date   Acute myocardial infarction (HCC) 07/26/2019   Allergy    BPH (benign prostatic hyperplasia)    CAD (coronary artery disease)    Depression 09/11/2013   Hemorrhoids    Hyperlipidemia    Hypertension    Peripheral neuropathy    Seborrhea    STEMI (ST elevation myocardial infarction) (HCC)    Subclinical hypothyroidism     Social History   Socioeconomic  History   Marital status: Single    Spouse name: Not on file   Number of children: 2   Years of education: Not on file   Highest education level: Not on file  Occupational History   Not on file  Tobacco Use   Smoking status: Former    Current packs/day: 0.00    Average packs/day: 1 pack/day for 7.0 years (7.0 ttl pk-yrs)    Types: Cigarettes    Start date: 92    Quit date: 77    Years since quitting: 48.9   Smokeless tobacco: Never  Vaping Use   Vaping status: Never Used  Substance and Sexual Activity   Alcohol use: No   Drug use: No   Sexual activity: Not on file  Other Topics Concern   Not on file  Social History Narrative   Not on file   Social Drivers of Health   Tobacco Use: Medium Risk (04/20/2024)   Patient History    Smoking Tobacco Use: Former    Smokeless Tobacco Use: Never    Passive Exposure: Not on Actuary Strain: Not on file  Food Insecurity: Not on file  Transportation Needs: Not on file  Physical Activity: Not on file  Stress: Not on file  Social Connections: Not on file  Intimate Partner Violence: Not on file  Depression (PHQ2-9): Low Risk (04/20/2024)   Depression (PHQ2-9)    PHQ-2 Score: 0  Alcohol Screen: Not on file  Housing: Not on file  Utilities: Not on file  Health Literacy: Not on file    Past Surgical History:  Procedure Laterality Date   APPENDECTOMY     CORONARY STENT INTERVENTION N/A 07/26/2019   Procedure: CORONARY STENT INTERVENTION;  Surgeon: Elmira Newman PARAS, MD;  Location: [facility];  Service: Cardiovascular;  Laterality: N/A;   CORONARY THROMBECTOMY N/A 07/26/2019   Procedure: Coronary Thrombectomy;  Surgeon: Elmira Newman PARAS, MD;  Location: [facility];  Service: Cardiovascular;  Laterality: N/A;   CORONARY/GRAFT ACUTE MI REVASCULARIZATION N/A 07/26/2019   Procedure: Coronary/Graft Acute MI Revascularization;  Surgeon: Elmira Newman PARAS, MD;  Location: [facility];   Service: Cardiovascular;  Laterality: N/A;   LEFT HEART CATH AND CORONARY ANGIOGRAPHY N/A 07/26/2019   Procedure: LEFT HEART CATH AND CORONARY ANGIOGRAPHY;  Surgeon: Elmira Newman PARAS, MD;  Location: [facility];  Service: Cardiovascular;  Laterality: N/A;    Family History  Problem Relation Age of Onset   Heart attack Brother    Hyperlipidemia Mother    Diabetes Mother    Alzheimer's disease Father     Allergies[1]  Medications Ordered Prior to Encounter[2]  BP 130/82   Pulse 81   Temp 97.8 F (36.6 C) (Oral)   Ht 5' 11 (1.803 m)   Wt 162 lb 4 oz (73.6 kg)   SpO2 96%   BMI 22.63 kg/m  Objective:   Physical Exam Constitutional:      Appearance: He is not ill-appearing.  HENT:     Right Ear: Tympanic membrane and ear canal normal.     Left Ear: Tympanic membrane and ear canal normal.     Nose: No mucosal edema.     Right Sinus: No maxillary sinus tenderness or frontal sinus tenderness.     Left Sinus: No maxillary sinus tenderness or frontal sinus tenderness.     Mouth/Throat:     Mouth: Mucous membranes are moist.  Eyes:     Conjunctiva/sclera: Conjunctivae normal.  Cardiovascular:     Rate and Rhythm: Normal rate and regular rhythm.  Pulmonary:     Effort: Pulmonary effort is normal.     Breath sounds: Normal breath sounds. No wheezing or rhonchi.  Musculoskeletal:     Cervical back: Neck supple.  Skin:    General: Skin is warm and dry.     Physical Exam        Assessment & Plan:  Generalized body aches -     POC COVID-19 BinaxNow -     POCT Influenza A/B  Chronic bilateral thoracic back pain Assessment & Plan: Acute on chronic.  Improving which is good. We discussed Tylenol  use and other conservative treatment.    Coronary artery disease involving native coronary artery of native heart without angina pectoris -     Metoprolol  Succinate ER; Take 1 tablet (25 mg total) by mouth daily. For blood pressure and heart  Dispense: 90 tablet;  Refill: 1 -     Lisinopril ; Take 1 tablet (5 mg total) by mouth daily. for blood pressure.  Dispense: 90 tablet; Refill: 1  Acute cough Assessment & Plan: Symptoms representative of viral illness.  Rapid influenza and Covid-19 tests negative today.  Fortunately, he is feeling better. We discussed the trajectory of viral illness.  Continue Tylenol , rest, fluid. He will update next week if his symptoms do not resolve or if they progress.     Assessment and Plan  Assessment & Plan         Comer MARLA Gaskins, NP       [1] No Known Allergies [2]  Current Outpatient Medications on File Prior to Visit  Medication Sig Dispense Refill   aspirin  EC 81 MG tablet Take 1 tablet (81 mg total) by mouth daily. Swallow whole. 90 tablet 3   atorvastatin  (LIPITOR ) 80 MG tablet TAKE 1 TABLET BY MOUTH DAILY AT 6 PM. 90 tablet 1   Multiple Vitamin (MULTIVITAMIN) tablet Take 1 tablet by mouth daily.     No current facility-administered medications on file prior to visit.

## 2024-04-24 ENCOUNTER — Ambulatory Visit: Payer: Self-pay

## 2024-04-24 NOTE — Telephone Encounter (Signed)
 Reason for Triage: patient was seen by pcp on 12/18 for irration to the skin. Patient states the irration is continuing. Please contact [phone]   Phone call placed to patient-no answer. Unable to leave a voicemail. Will route to the office.

## 2024-04-24 NOTE — Telephone Encounter (Signed)
 Attempt # 1 to reach patient to triage symptoms. Unable to leave VM to call back    Message from Hypol[place]o S sent at 04/24/2024  9:02 AM EST  Reason for Triage: patient was seen by pcp on 12/18 for irration to the skin. Patient states the irration is continuing. Please contact [phone]

## 2024-04-24 NOTE — Telephone Encounter (Signed)
 Message from Cory Wong sent at 04/24/2024  9:02 AM EST  Reason for Triage: patient was seen by pcp on 12/18 for irration to the skin. Patient states the irration is continuing. Please contact [phone]   Phone call placed to patient-not able to leave a voicemail.

## 2024-04-25 NOTE — Telephone Encounter (Signed)
 Attempted to contact pt. No answer, no vm. Pls relay Kate's message concerning skin irritation.

## 2024-04-25 NOTE — Telephone Encounter (Signed)
 Patient was evaluated for an acute cough and back pain on 04/20/2024.  Can we get some additional information regarding the skin irritation?

## 2024-04-26 NOTE — Telephone Encounter (Signed)
 Attempted to contact pt. No answer, no vm. Pls relay Kate's message concerning skin irritation.

## 2024-05-01 NOTE — Telephone Encounter (Signed)
 Attempted to contact pt. No answer, no vm. Pls relay Kate's message concerning skin irritation.   Mailing a letter.

## 2024-05-12 ENCOUNTER — Other Ambulatory Visit (INDEPENDENT_AMBULATORY_CARE_PROVIDER_SITE_OTHER)

## 2024-05-12 ENCOUNTER — Ambulatory Visit: Payer: Self-pay | Admitting: Primary Care

## 2024-05-12 DIAGNOSIS — R7303 Prediabetes: Secondary | ICD-10-CM

## 2024-05-12 LAB — POCT GLYCOSYLATED HEMOGLOBIN (HGB A1C): Hemoglobin A1C: 5.5 % (ref 4.0–5.6)

## 2024-05-15 ENCOUNTER — Telehealth: Payer: Self-pay

## 2024-05-15 NOTE — Telephone Encounter (Signed)
 Copied from CRM #[id number]. Topic: Clinical - Medical Advice >> May 15, 2024 11:48 AM Deleta RAMAN wrote: Reason for CRM: patient daughter would like to know if patient can be tested for alzheimer or dementia [phone]. >> May 15, 2024 11:52 AM Deleta RAMAN wrote: Patient daughter would like to be primary contact

## 2024-05-15 NOTE — Telephone Encounter (Signed)
 Called patient daughter on dpr left message to call office.

## 2024-05-15 NOTE — Telephone Encounter (Signed)
 Noted

## 2024-05-15 NOTE — Telephone Encounter (Signed)
 Daughter called back. She has noticed changes in conative ability as well as memory. This has increased over  months. Patient has started to notice changes and request evaluation.  I have setup for first appointment time/day that worked for them. They will call if any questions.

## 2024-05-26 ENCOUNTER — Ambulatory Visit (INDEPENDENT_AMBULATORY_CARE_PROVIDER_SITE_OTHER): Admitting: Primary Care

## 2024-05-26 ENCOUNTER — Encounter: Payer: Self-pay | Admitting: Primary Care

## 2024-05-26 VITALS — BP 126/74 | HR 61 | Temp 97.9°F | Ht 71.0 in | Wt 166.2 lb

## 2024-05-26 DIAGNOSIS — R413 Other amnesia: Secondary | ICD-10-CM | POA: Diagnosis not present

## 2024-05-26 NOTE — Patient Instructions (Signed)
 Stop by the lab prior to leaving today. I will notify you of your results once received.   You will receive a phone call regarding the CT scan of your brain.  Namenda or Aricept are options for memory.  It was a pleasure to see you today!

## 2024-05-26 NOTE — Progress Notes (Signed)
 "  Subjective:    Patient ID: Cory Wong, male    DOB: 1955/09/20, 69 y.o.   MRN: [id number]  Cory Wong is a very pleasant 69 y.o. male with a history of hypertension, CAD with STEMI, hypothyroidism, hyperlipidemia, who presents today to discuss memory changes.  Her daughter joins us  today.  Chronic, over the last several months he's noticed memory changes.   Frequently he will get up to do something in the house and forgets what he was doing. Sometimes he will be in conversation and forget short term conversations. He struggles to concentrate on things such as writing checks. He has to write everything down to remember.   When he's at work he's able to concentrate after getting started as his job is repetitive. If someone at works asks him a question he quickly forgets the question. He finds that he has to really concentrate on what he's doing. He often forgets where he puts objects and what he was supposed to do with the object. He forgets where he's going when driving familiar places. He is sleeping well.   He has a family history of Alzheimer's in his father.   His daughter as noticed that he has difficulty writing words and places to write the words. He misspells easy words. Also saying the same sentence repetitively as he forgets what was said in conversation. He does have normal conversations and symptoms are transient but occur often.    Review of Systems  Respiratory:  Negative for shortness of breath.   Cardiovascular:  Negative for chest pain.  Neurological:  Negative for dizziness, speech difficulty and headaches.  Psychiatric/Behavioral:  Positive for confusion. Negative for sleep disturbance. The patient is not nervous/anxious.          Past Medical History:  Diagnosis Date   Acute myocardial infarction (HCC) 07/26/2019   Allergy    BPH (benign prostatic hyperplasia)    CAD (coronary artery disease)    Depression 09/11/2013   Hemorrhoids     Hyperlipidemia    Hypertension    Peripheral neuropathy    Seborrhea    STEMI (ST elevation myocardial infarction) (HCC)    Subclinical hypothyroidism     Social History   Socioeconomic History   Marital status: Single    Spouse name: Not on file   Number of children: 2   Years of education: Not on file   Highest education level: Not on file  Occupational History   Not on file  Tobacco Use   Smoking status: Former    Current packs/day: 0.00    Average packs/day: 1 pack/day for 7.0 years (7.0 ttl pk-yrs)    Types: Cigarettes    Start date: 26    Quit date: 74    Years since quitting: 49.0   Smokeless tobacco: Never  Vaping Use   Vaping status: Never Used  Substance and Sexual Activity   Alcohol use: No   Drug use: No   Sexual activity: Not on file  Other Topics Concern   Not on file  Social History Narrative   Not on file   Social Drivers of Health   Tobacco Use: Medium Risk (05/26/2024)   Patient History    Smoking Tobacco Use: Former    Smokeless Tobacco Use: Never    Passive Exposure: Not on Actuary Strain: Not on file  Food Insecurity: Not on file  Transportation Needs: Not on file  Physical Activity: Not on file  Stress: Not  on file  Social Connections: Not on file  Intimate Partner Violence: Not on file  Depression [phone]): Low Risk (05/26/2024)   Depression (PHQ2-9)    PHQ-2 Score: 0  Alcohol Screen: Not on file  Housing: Not on file  Utilities: Not on file  Health Literacy: Not on file    Past Surgical History:  Procedure Laterality Date   APPENDECTOMY     CORONARY STENT INTERVENTION N/A 07/26/2019   Procedure: CORONARY STENT INTERVENTION;  Surgeon: Elmira Newman PARAS, MD;  Location: [facility];  Service: Cardiovascular;  Laterality: N/A;   CORONARY THROMBECTOMY N/A 07/26/2019   Procedure: Coronary Thrombectomy;  Surgeon: Elmira Newman PARAS, MD;  [facility]: [facility];  Service: Cardiovascular;  Laterality:  N/A;   CORONARY/GRAFT ACUTE MI REVASCULARIZATION N/A 07/26/2019   Procedure: Coronary/Graft Acute MI Revascularization;  Surgeon: Elmira Newman PARAS, MD;  [facility]: [facility];  Service: Cardiovascular;  Laterality: N/A;   LEFT HEART CATH AND CORONARY ANGIOGRAPHY N/A 07/26/2019   Procedure: LEFT HEART CATH AND CORONARY ANGIOGRAPHY;  Surgeon: Elmira Newman PARAS, MD;  Location: [facility];  Service: Cardiovascular;  Laterality: N/A;    Family History  Problem Relation Age of Onset   Heart attack Brother    Hyperlipidemia Mother    Diabetes Mother    Alzheimer's disease Father     Allergies[1]  Medications Ordered Prior to Encounter[2]  BP 126/74   Pulse 61   Temp 97.9 F (36.6 C) (Oral)   Ht 5' 11 (1.803 m)   Wt 166 lb 4 oz (75.4 kg)   SpO2 99%   BMI 23.19 kg/m  Objective:   Physical Exam Eyes:     Extraocular Movements: Extraocular movements intact.  Cardiovascular:     Rate and Rhythm: Normal rate and regular rhythm.  Pulmonary:     Effort: Pulmonary effort is normal.     Breath sounds: Normal breath sounds.  Musculoskeletal:     Cervical back: Neck supple.  Skin:    General: Skin is warm and dry.  Neurological:     Mental Status: He is alert and oriented to person, place, and time.     Cranial Nerves: No cranial nerve deficit.     Coordination: Coordination normal.  Psychiatric:        Mood and Affect: Mood normal.     Physical Exam        Assessment & Plan:  Memory changes Assessment & Plan: HPI suggestive of early dementia. 6CIT score of 14 today.  Will rule out metabolic cause today by checking labs including B12, CBC, BMP, Lyme disease. We will also add the AD-Detect for Alzheimer's evaluation.  CT head ordered and pending. There are no signs of acute stroke today.  We discussed options for treatment which included medications. We also discussed to remain active and keep the mind occupied.  His family will think about  medication options and we will await results.  Orders: -     CT HEAD WO CONTRAST ( ); Future -     CBC -     Basic metabolic panel with GFR -     TSH -     Vitamin B12 -     AD-DETECT ABETA 42/40 AND [id number] EVALUATION, PLASMA -     B. burgdorfi antibodies by WB    Assessment and Plan Assessment & Plan         Comer MARLA Gaskins, NP       [1] No  Known Allergies [2]  Current Outpatient Medications on File Prior to Visit  Medication Sig Dispense Refill   aspirin  EC 81 MG tablet Take 1 tablet (81 mg total) by mouth daily. Swallow whole. 90 tablet 3   atorvastatin  (LIPITOR ) 80 MG tablet TAKE 1 TABLET BY MOUTH DAILY AT 6 PM. 90 tablet 1   lisinopril  (ZESTRIL ) 5 MG tablet Take 1 tablet (5 mg total) by mouth daily. for blood pressure. 90 tablet 1   metoprolol  succinate (TOPROL -XL) 25 MG 24 hr tablet Take 1 tablet (25 mg total) by mouth daily. For blood pressure and heart 90 tablet 1   Multiple Vitamin (MULTIVITAMIN) tablet Take 1 tablet by mouth daily.     No current facility-administered medications on file prior to visit.   "

## 2024-05-26 NOTE — Assessment & Plan Note (Signed)
 HPI suggestive of early dementia. 6CIT score of 14 today.  Will rule out metabolic cause today by checking labs including B12, CBC, BMP, Lyme disease. We will also add the AD-Detect for Alzheimer's evaluation.  CT head ordered and pending. There are no signs of acute stroke today.  We discussed options for treatment which included medications. We also discussed to remain active and keep the mind occupied.  His family will think about medication options and we will await results.

## 2024-05-28 ENCOUNTER — Ambulatory Visit: Payer: Self-pay | Admitting: Primary Care

## 2024-05-28 DIAGNOSIS — R7989 Other specified abnormal findings of blood chemistry: Secondary | ICD-10-CM

## 2024-05-31 ENCOUNTER — Other Ambulatory Visit (INDEPENDENT_AMBULATORY_CARE_PROVIDER_SITE_OTHER)

## 2024-05-31 DIAGNOSIS — R7989 Other specified abnormal findings of blood chemistry: Secondary | ICD-10-CM | POA: Diagnosis not present

## 2024-06-01 LAB — CBC
HCT: 41.3 % (ref 39.4–51.1)
Hemoglobin: 13.7 g/dL (ref 13.2–17.1)
MCH: 30.6 pg (ref 27.0–33.0)
MCHC: 33.2 g/dL (ref 31.6–35.4)
MCV: 92.4 fL (ref 81.4–101.7)
MPV: 11.5 fL (ref 7.5–12.5)
Platelets: 235 10*3/uL (ref 140–400)
RBC: 4.47 Million/uL (ref 4.20–5.80)
RDW: 12.9 % (ref 11.0–15.0)
WBC: 7.4 10*3/uL (ref 3.8–10.8)

## 2024-06-01 LAB — VITAMIN B12: Vitamin B-12: 421 pg/mL (ref 200–1100)

## 2024-06-01 LAB — B. BURGDORFI ANTIBODIES BY WB

## 2024-06-01 LAB — T4, FREE: Free T4: 0.77 ng/dL (ref 0.60–1.60)

## 2024-06-01 LAB — BASIC METABOLIC PANEL WITH GFR
BUN: 11 mg/dL (ref 7–25)
CO2: 30 mmol/L (ref 20–32)
Calcium: 9.4 mg/dL (ref 8.6–10.3)
Chloride: 104 mmol/L (ref 98–110)
Creat: 1.17 mg/dL (ref 0.70–1.35)
Glucose, Bld: 85 mg/dL (ref 65–99)
Potassium: 4.4 mmol/L (ref 3.5–5.3)
Sodium: 142 mmol/L (ref 135–146)
eGFR: 69 mL/min/{1.73_m2}

## 2024-06-01 LAB — TSH: TSH: 4.7 m[IU]/L — ABNORMAL HIGH (ref 0.40–4.50)

## 2024-06-02 ENCOUNTER — Ambulatory Visit: Payer: Self-pay | Admitting: Primary Care

## 2024-06-04 LAB — AD-DETECT ABETA 42/40 AND P-TAU217 EVALUATION, PLASMA
ABETA 40: 249 pg/mL
ABETA 42/40 RATIO: 0.149
ABETA 42: 37 pg/mL
AD DETECT PTAU217: 0.61 pg/mL
INTERPRETATION: HIGH
LIKELIHOOD SCORE: 0.8348 — ABNORMAL HIGH
